# Patient Record
Sex: Female | Born: 1995 | Race: White | Hispanic: No | Marital: Single | Smoking: Never smoker
Health system: Southern US, Community
[De-identification: ages and names within clinical notes are randomized; demographics above are authoritative.]

## PROBLEM LIST (undated history)

## (undated) VITALS — BP 80/46 | HR 109 | Temp 98.0°F | Resp 18 | Ht 69.25 in | Wt 187.4 lb

## (undated) DIAGNOSIS — F913 Oppositional defiant disorder: Secondary | ICD-10-CM

## (undated) DIAGNOSIS — J302 Other seasonal allergic rhinitis: Secondary | ICD-10-CM

## (undated) DIAGNOSIS — F909 Attention-deficit hyperactivity disorder, unspecified type: Secondary | ICD-10-CM

## (undated) DIAGNOSIS — F319 Bipolar disorder, unspecified: Secondary | ICD-10-CM

## (undated) DIAGNOSIS — E669 Obesity, unspecified: Secondary | ICD-10-CM

## (undated) HISTORY — DX: Other seasonal allergic rhinitis: J30.2

## (undated) HISTORY — DX: Bipolar disorder, unspecified: F31.9

## (undated) HISTORY — DX: Oppositional defiant disorder: F91.3

## (undated) HISTORY — DX: Attention-deficit hyperactivity disorder, unspecified type: F90.9

## (undated) HISTORY — PX: ADENOIDECTOMY WITH MYRINGOTOMY: SHX5715

## (undated) HISTORY — PX: OTHER SURGICAL HISTORY: SHX169

---
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
## 2016-01-23 ENCOUNTER — Other Ambulatory Visit: Payer: Self-pay | Admitting: Pediatrics

## 2016-02-13 ENCOUNTER — Encounter: Payer: Medicaid Other | Admitting: Pediatrics

## 2016-02-23 ENCOUNTER — Ambulatory Visit (INDEPENDENT_AMBULATORY_CARE_PROVIDER_SITE_OTHER): Payer: Medicaid Other | Admitting: Psychiatry

## 2016-02-23 ENCOUNTER — Encounter (HOSPITAL_COMMUNITY): Payer: Self-pay | Admitting: Psychiatry

## 2016-02-23 ENCOUNTER — Other Ambulatory Visit: Payer: Self-pay | Admitting: Pediatrics

## 2016-02-23 VITALS — BP 90/55 | HR 80 | Ht 68.01 in | Wt 196.0 lb

## 2016-02-23 DIAGNOSIS — F39 Unspecified mood [affective] disorder: Secondary | ICD-10-CM | POA: Diagnosis not present

## 2016-02-23 DIAGNOSIS — F902 Attention-deficit hyperactivity disorder, combined type: Secondary | ICD-10-CM | POA: Diagnosis not present

## 2016-02-23 MED ORDER — RISPERIDONE 0.5 MG PO TABS: 0.5000 mg | ORAL_TABLET | Freq: Two times a day (BID) | ORAL | 3 refills | Status: DC

## 2016-02-23 MED ORDER — GUANFACINE HCL 1 MG PO TABS: 1.0000 mg | ORAL_TABLET | Freq: Every day | ORAL | 2 refills | Status: DC

## 2016-02-23 MED ORDER — TRAZODONE HCL 100 MG PO TABS: 200.0000 mg | ORAL_TABLET | Freq: Every day | ORAL | 3 refills | Status: DC

## 2016-02-23 MED ORDER — BUPROPION HCL ER (XL) 300 MG PO TB24: 300.0000 mg | ORAL_TABLET | Freq: Every day | ORAL | 2 refills | Status: DC
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
## 2016-03-01 ENCOUNTER — Telehealth (HOSPITAL_COMMUNITY): Payer: Self-pay | Admitting: *Deleted

## 2016-03-01 NOTE — Telephone Encounter (Signed)
patient's medical records picked up today by Arlis Porta at 4:15 p.m. [place] [facility] # [id number].

## 2016-03-06 ENCOUNTER — Encounter: Payer: Self-pay | Admitting: Pediatrics

## 2016-03-06 ENCOUNTER — Ambulatory Visit (INDEPENDENT_AMBULATORY_CARE_PROVIDER_SITE_OTHER): Payer: Medicaid Other | Admitting: Pediatrics

## 2016-03-06 VITALS — BP 90/62 | Ht 68.11 in | Wt 196.6 lb

## 2016-03-06 DIAGNOSIS — M545 Low back pain, unspecified: Secondary | ICD-10-CM

## 2016-03-06 DIAGNOSIS — Z113 Encounter for screening for infections with a predominantly sexual mode of transmission: Secondary | ICD-10-CM | POA: Diagnosis not present

## 2016-03-06 DIAGNOSIS — Z68.41 Body mass index (BMI) pediatric, greater than or equal to 95th percentile for age: Secondary | ICD-10-CM

## 2016-03-06 DIAGNOSIS — Z0000 Encounter for general adult medical examination without abnormal findings: Secondary | ICD-10-CM

## 2016-03-06 DIAGNOSIS — E7800 Pure hypercholesterolemia, unspecified: Secondary | ICD-10-CM | POA: Diagnosis not present

## 2016-03-06 DIAGNOSIS — IMO0001 Reserved for inherently not codable concepts without codable children: Secondary | ICD-10-CM

## 2016-03-06 MED ORDER — MONTELUKAST SODIUM 10 MG PO TABS: ORAL_TABLET | ORAL | 3 refills | Status: DC

## 2016-03-06 MED ORDER — NORGESTIM-ETH ESTRAD TRIPHASIC 0.18/0.215/0.25 MG-25 MCG PO TABS: 1.0000 | ORAL_TABLET | Freq: Every day | ORAL | 6 refills | Status: DC

## 2016-03-06 MED ORDER — AZELASTINE HCL 0.15 % NA SOLN: NASAL | 3 refills | Status: DC

## 2016-03-06 MED ORDER — CETIRIZINE HCL 10 MG PO TABS: ORAL_TABLET | ORAL | 6 refills | Status: DC

## 2016-03-06 NOTE — Progress Notes (Signed)
Lower back lmp end oj July Routine Well-Adolescent Visit  Tracy [name]'s personal or confidential phone number:   PCP: Tracy LeavenMary Jo Kayvon Mo, MD   History was provided by the patient and mother.  Tracy [name] is a 20 y.o. female who is here for physical.   Current concerns: has c/o lower back pain, is nonradiating no h/o injury. Mom stated has had for years, had previous evaluation including xrays in 2013,  No Known Allergies  Current Outpatient Prescriptions on File Prior to Visit  Medication Sig Dispense Refill  . buPROPion (WELLBUTRIN XL) 300 MG 24 hr tablet Take 1 tablet (300 mg total) by mouth daily. 90 tablet 2  . guanFACINE (TENEX) 1 MG tablet Take 1 tablet (1 mg total) by mouth at bedtime. 90 tablet 2  . polyethylene glycol (MIRALAX / GLYCOLAX) packet Take 17 g by mouth daily. Reported on 08/16/2015    . risperiDONE (RISPERDAL) 0.5 MG tablet Take 1 tablet (0.5 mg total) by mouth 2 (two) times daily. 180 tablet 3  . traZODone (DESYREL) 100 MG tablet Take 2 tablets (200 mg total) by mouth at bedtime. 180 tablet 3   No current facility-administered medications on file prior to visit.     Past Medical History:  Diagnosis Date  . ADHD (attention deficit hyperactivity disorder)   . Bipolar disorder (HCC)   . Obesity   . Oppositional defiant disorder   . Seasonal allergies     ROS:     Constitutional  Afebrile, normal appetite, normal activity.   Opthalmologic  no irritation or drainage.   ENT  no rhinorrhea or congestion , no sore throat, no ear pain. Cardiovascular  No chest pain Respiratory  no cough , wheeze or chest pain.  Gastointestinal  no abdominal pain, nausea or vomiting, bowel movements normal.     Genitourinary  no urgency, frequency or dysuria.   Musculoskeletal  no complaints of pain, no injuries.   Dermatologic  no rashes or lesions Neurologic - no significant history of headaches, no weakness  family history includes ADD / ADHD in her sister;  Bipolar disorder in her maternal grandmother and sister.    Adolescent Assessment:  Confidentiality was discussed with the patient and if applicable, with caregiver as well.  Home and Environment:  Social History   Social History Narrative   Lives with mom and younger sister, will be attending college this year     Sports/Exercise:  regularly has been walking   Education and Employment:  School Status: graduated HS, to attend community college School History:  Work:  Activities:  With parent out of the room and confidentiality discussed:   Patient reports being comfortable and safe at school and at home? Yes  Smoking: no Secondhand smoke exposure? yes -  Drugs/EtOH: no   Sexuality:  -Menarche: age - females:  last menses: last month  - Sexually active? no  - sexual partners in last year:  - contraception use: abstinence - Last STI Screening: 11/05/14  - Violence/Abuse:   Mood: Suicidality and Depression:  Weapons:   Screenings:  addition, the following topics were discussed as part of anticipatory guidance exercise.  PHQ-9 completed and results indicated mild issues score 8, has counseling high score for slow speech but has learning disability   Hearing Screening   125Hz  250Hz  500Hz  1000Hz  2000Hz  3000Hz  4000Hz  6000Hz  8000Hz   Right ear:   20 20 20 20 20     Left ear:   20 20 20 20  20  Visual Acuity Screening   Right eye Left eye Both eyes  Without correction: 20/30 20/25   With correction:         Physical Exam:  BP 90/62   Ht 5' 8.11" (1.73 m)   Wt 196 lb 9.6 oz (89.2 kg)   BMI 29.80 kg/m   Weight: 97 %ile (Z= 1.91) based on CDC 2-20 Years weight-for-age data using vitals from 03/06/2016. Normalized weight-for-stature data available only for age 33 to 5 years.  Height: 93 %ile (Z= 1.51) based on CDC 2-20 Years stature-for-age data using vitals from 03/06/2016.  Blood pressure percentiles are 1.2 % systolic and 33.4 % diastolic based on NHBPEP's 4th  Report.     Objective:          General alert in NAD  Derm   no rashes or lesions  Head Normocephalic, atraumatic                    Eyes Normal, no discharge  Ears:   TMs normal bilaterally  Nose:   patent normal mucosa, turbinates normal, no rhinorhea  Oral cavity  moist mucous membranes, no lesions  Throat:   normal tonsils, without exudate or erythema  Neck:   .supple FROM  Lymph:  no significant cervical adenopathy  Breast  Tanner 5  Lungs:   clear with equal breath sounds bilaterally  Heart regular rate and rhythm, no murmur  Abdomen soft nontender no organomegaly or masses  GU:  normal female Tanner 5  back No deformity no scoliosis  Extremities:   no deformity  Neuro:  intact no focal defects       Assessment/Plan:  1. Well adult   2. Routine screening for STI (sexually transmitted infection)  - GC/Chlamydia Probe Amp - HIV antibody (with reflex)  3. Elevated cholesterol Was previously prevastatin, for mild elevation last cholesterol off med 220 , should repeat fasting. If higher may need referral for management - Lipid panel - Comprehensive metabolic panel   4. BMI 29.0-29.9,adult Weight and BMI stable,   5. Midline low back pain without sciatica Pain c/w with muscle strain, chronic low back pain. Would benefit from moderate weight loss , some pain likely due to large bust- ( hasDD cup), advised core muscle stregthening exercises   .  BMI: is not appropriate for age  Counseling completed for all of the following vaccine components  Orders Placed This Encounter  Procedures  . GC/Chlamydia Probe Amp  . Lipid panel  . Comprehensive metabolic panel  . HIV antibody (with reflex)    Return in 6 months (on 09/06/2016).  Tracy Leaven, MD

## 2016-03-06 NOTE — Patient Instructions (Signed)
Try core strenghtening exercises for back pain. - leg lifts, planking, crunches. Weight is good but a small weight loss may also help the pain  Back Pain, Adult Back pain is very common in adults.The cause of back pain is rarely dangerous and the pain often gets better over time.The cause of your back pain may not be known. Some common causes of back pain include:  Strain of the muscles or ligaments supporting the spine.  Wear and tear (degeneration) of the spinal disks.  Arthritis.  Direct injury to the back. For many people, back pain may return. Since back pain is rarely dangerous, most people can learn to manage this condition on their own. HOME CARE INSTRUCTIONS Watch your back pain for any changes. The following actions may help to lessen any discomfort you are feeling:  Remain active. It is stressful on your back to sit or stand in one place for long periods of time. Do not sit, drive, or stand in one place for more than 30 minutes at a time. Take short walks on even surfaces as soon as you are able.Try to increase the length of time you walk each day.  Exercise regularly as directed by your health care provider. Exercise helps your back heal faster. It also helps avoid future injury by keeping your muscles strong and flexible.  Do not stay in bed.Resting more than 1-2 days can delay your recovery.  Pay attention to your body when you bend and lift. The most comfortable positions are those that put less stress on your recovering back. Always use proper lifting techniques, including:  Bending your knees.  Keeping the load close to your body.  Avoiding twisting.  Find a comfortable position to sleep. Use a firm mattress and lie on your side with your knees slightly bent. If you lie on your back, put a pillow under your knees.  Avoid feeling anxious or stressed.Stress increases muscle tension and can worsen back pain.It is important to recognize when you are anxious or  stressed and learn ways to manage it, such as with exercise.  Take medicines only as directed by your health care provider. Over-the-counter medicines to reduce pain and inflammation are often the most helpful.Your health care provider may prescribe muscle relaxant drugs.These medicines help dull your pain so you can more quickly return to your normal activities and healthy exercise.  Apply ice to the injured area:  Put ice in a plastic bag.  Place a towel between your skin and the bag.  Leave the ice on for 20 minutes, 2-3 times a day for the first 2-3 days. After that, ice and heat may be alternated to reduce pain and spasms.  Maintain a healthy weight. Excess weight puts extra stress on your back and makes it difficult to maintain good posture. SEEK MEDICAL CARE IF:  You have pain that is not relieved with rest or medicine.  You have increasing pain going down into the legs or buttocks.  You have pain that does not improve in one week.  You have night pain.  You lose weight.  You have a fever or chills. SEEK IMMEDIATE MEDICAL CARE IF:   You develop new bowel or bladder control problems.  You have unusual weakness or numbness in your arms or legs.  You develop nausea or vomiting.  You develop abdominal pain.  You feel faint.   This information is not intended to replace advice given to you by your health care provider. Make sure you discuss  any questions you have with your health care provider.   Document Released: 07/16/2005 Document Revised: 08/06/2014 Document Reviewed: 11/17/2013 Elsevier Interactive Patient Education [employer].

## 2016-03-07 LAB — GC/CHLAMYDIA PROBE AMP
CT Probe RNA: NOT DETECTED
GC Probe RNA: NOT DETECTED

## 2016-03-17 LAB — COMPREHENSIVE METABOLIC PANEL
ALT: 11 U/L (ref 5–32)
AST: 14 U/L (ref 12–32)
Albumin: 3.8 g/dL (ref 3.6–5.1)
Alkaline Phosphatase: 52 U/L (ref 47–176)
BUN: 8 mg/dL (ref 7–20)
CO2: 25 mmol/L (ref 20–31)
Calcium: 8.7 mg/dL — ABNORMAL LOW (ref 8.9–10.4)
Chloride: 105 mmol/L (ref 98–110)
Creat: 1.01 mg/dL — ABNORMAL HIGH (ref 0.50–1.00)
Glucose, Bld: 75 mg/dL (ref 65–99)
Potassium: 4.7 mmol/L (ref 3.8–5.1)
Sodium: 140 mmol/L (ref 135–146)
Total Bilirubin: 0.3 mg/dL (ref 0.2–1.1)
Total Protein: 6 g/dL — ABNORMAL LOW (ref 6.3–8.2)

## 2016-03-17 LAB — LIPID PANEL
Cholesterol: 230 mg/dL — ABNORMAL HIGH (ref 125–170)
HDL: 57 mg/dL (ref 36–76)
LDL Cholesterol: 155 mg/dL — ABNORMAL HIGH (ref <110)
Total CHOL/HDL Ratio: 4 Ratio (ref <=5.0)
Triglycerides: 91 mg/dL (ref 40–136)
VLDL: 18 mg/dL (ref <30)

## 2016-03-18 LAB — HIV ANTIBODY (ROUTINE TESTING W REFLEX): HIV 1&2 Ab, 4th Generation: NONREACTIVE

## 2016-03-19 ENCOUNTER — Telehealth: Payer: Self-pay | Admitting: Pediatrics

## 2016-03-19 DIAGNOSIS — E785 Hyperlipidemia, unspecified: Secondary | ICD-10-CM

## 2016-03-19 NOTE — Telephone Encounter (Signed)
Spoke with mom,  Reviewed results , cholesterol and LDL has increased, will refer endocrinology for evaluation

## 2016-05-24 ENCOUNTER — Encounter (HOSPITAL_COMMUNITY): Payer: Self-pay | Admitting: Psychiatry

## 2016-05-24 ENCOUNTER — Ambulatory Visit (INDEPENDENT_AMBULATORY_CARE_PROVIDER_SITE_OTHER): Payer: Medicaid Other | Admitting: Psychiatry

## 2016-05-24 VITALS — BP 98/47 | HR 73 | Ht 68.12 in | Wt 191.0 lb

## 2016-05-24 DIAGNOSIS — Z818 Family history of other mental and behavioral disorders: Secondary | ICD-10-CM | POA: Diagnosis not present

## 2016-05-24 DIAGNOSIS — F39 Unspecified mood [affective] disorder: Secondary | ICD-10-CM

## 2016-05-24 DIAGNOSIS — F902 Attention-deficit hyperactivity disorder, combined type: Secondary | ICD-10-CM | POA: Diagnosis not present

## 2016-05-24 MED ORDER — RISPERIDONE 0.5 MG PO TABS: 0.5000 mg | ORAL_TABLET | Freq: Two times a day (BID) | ORAL | 3 refills | Status: DC

## 2016-05-24 MED ORDER — TRAZODONE HCL 100 MG PO TABS: 200.0000 mg | ORAL_TABLET | Freq: Every day | ORAL | 3 refills | Status: DC

## 2016-05-24 MED ORDER — BUPROPION HCL ER (XL) 300 MG PO TB24: 300.0000 mg | ORAL_TABLET | Freq: Every day | ORAL | 2 refills | Status: DC
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
## 2016-08-01 ENCOUNTER — Other Ambulatory Visit: Payer: Self-pay | Admitting: Pediatrics

## 2016-08-15 ENCOUNTER — Telehealth (HOSPITAL_COMMUNITY): Payer: Self-pay | Admitting: *Deleted

## 2016-08-15 ENCOUNTER — Ambulatory Visit (HOSPITAL_COMMUNITY): Payer: Medicaid Other | Admitting: Psychiatry

## 2016-08-15 NOTE — Telephone Encounter (Signed)
lmtcb on both numbers on file to resch appt for 08-15-16 due to weather.  

## 2016-08-20 ENCOUNTER — Ambulatory Visit (HOSPITAL_COMMUNITY): Payer: Medicaid Other | Admitting: Psychiatry

## 2016-08-26 ENCOUNTER — Other Ambulatory Visit: Payer: Self-pay | Admitting: Pediatrics

## 2016-09-07 ENCOUNTER — Ambulatory Visit (INDEPENDENT_AMBULATORY_CARE_PROVIDER_SITE_OTHER): Payer: Medicaid Other | Admitting: Psychiatry

## 2016-09-07 ENCOUNTER — Encounter (HOSPITAL_COMMUNITY): Payer: Self-pay | Admitting: Psychiatry

## 2016-09-07 VITALS — BP 105/68 | HR 85 | Ht 68.13 in | Wt 190.6 lb

## 2016-09-07 DIAGNOSIS — Z79899 Other long term (current) drug therapy: Secondary | ICD-10-CM | POA: Diagnosis not present

## 2016-09-07 DIAGNOSIS — F902 Attention-deficit hyperactivity disorder, combined type: Secondary | ICD-10-CM | POA: Diagnosis not present

## 2016-09-07 DIAGNOSIS — F39 Unspecified mood [affective] disorder: Secondary | ICD-10-CM

## 2016-09-07 DIAGNOSIS — Z818 Family history of other mental and behavioral disorders: Secondary | ICD-10-CM | POA: Diagnosis not present

## 2016-09-07 MED ORDER — TRAZODONE HCL 100 MG PO TABS: 200.0000 mg | ORAL_TABLET | Freq: Every day | ORAL | 3 refills | Status: DC

## 2016-09-07 MED ORDER — RISPERIDONE 0.5 MG PO TABS: 0.5000 mg | ORAL_TABLET | Freq: Two times a day (BID) | ORAL | 3 refills | Status: DC

## 2016-09-07 MED ORDER — BUPROPION HCL ER (XL) 300 MG PO TB24: 300.0000 mg | ORAL_TABLET | Freq: Every day | ORAL | 2 refills | Status: DC
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
## 2016-09-24 ENCOUNTER — Other Ambulatory Visit: Payer: Self-pay | Admitting: Pediatrics

## 2016-09-30 ENCOUNTER — Other Ambulatory Visit: Payer: Self-pay | Admitting: Pediatrics

## 2016-09-30 NOTE — Telephone Encounter (Signed)
[facility] to have follow up appt[facility]

## 2016-10-01 NOTE — Telephone Encounter (Signed)
lvm asking pt to call and make appointment

## 2016-10-09 ENCOUNTER — Ambulatory Visit: Payer: Medicaid Other | Admitting: Pediatrics

## 2016-10-16 ENCOUNTER — Encounter: Payer: Self-pay | Admitting: Pediatrics

## 2016-10-16 ENCOUNTER — Ambulatory Visit (INDEPENDENT_AMBULATORY_CARE_PROVIDER_SITE_OTHER): Payer: Medicaid Other | Admitting: Pediatrics

## 2016-10-16 VITALS — BP 120/70 | Temp 97.8°F | Wt 191.4 lb

## 2016-10-16 DIAGNOSIS — M546 Pain in thoracic spine: Secondary | ICD-10-CM | POA: Diagnosis not present

## 2016-10-16 DIAGNOSIS — Z3041 Encounter for surveillance of contraceptive pills: Secondary | ICD-10-CM | POA: Diagnosis not present

## 2016-10-16 DIAGNOSIS — E7800 Pure hypercholesterolemia, unspecified: Secondary | ICD-10-CM

## 2016-10-16 DIAGNOSIS — G8929 Other chronic pain: Secondary | ICD-10-CM

## 2016-10-16 NOTE — Progress Notes (Signed)
Cholesterol Chief Complaint  Patient presents with  . Follow-up    HPI Tracy Benerianna R Weilfrazieris here for follow-up for birth control , she is not having any issues. No breakthrough bleeding, no headaches. States still on BCP for menstrual control Still having back pain   History was provided by the . patient and mother.  No Known Allergies  Current Outpatient Prescriptions on File Prior to Visit  Medication Sig Dispense Refill  . Azelastine HCl (ASTEPRO) 0.15 % SOLN USE 2 SPRAYS INTO EACH NOSTRIL DAILY 30 mL 3  . buPROPion (WELLBUTRIN XL) 300 MG 24 hr tablet Take 1 tablet (300 mg total) by mouth daily. 90 tablet 2  . cetirizine (ZYRTEC) 10 MG tablet TAKE 1 TABLET(10 MG) BY MOUTH DAILY 30 tablet 6  . montelukast (SINGULAIR) 10 MG tablet TAKE 1 TABLET BY MOUTH EVERY DAY 30 tablet 0  . polyethylene glycol (MIRALAX / GLYCOLAX) packet Take 17 g by mouth daily. Reported on 08/16/2015    . risperiDONE (RISPERDAL) 0.5 MG tablet Take 1 tablet (0.5 mg total) by mouth 2 (two) times daily. 180 tablet 3  . traZODone (DESYREL) 100 MG tablet Take 2 tablets (200 mg total) by mouth at bedtime. 180 tablet 3  . TRINESSA LO 0.18/0.215/0.25 MG-25 MCG tab TAKE 1 TABLET BY MOUTH DAILY 28 tablet 0   No current facility-administered medications on file prior to visit.     Past Medical History:  Diagnosis Date  . ADHD (attention deficit hyperactivity disorder)   . Bipolar disorder (HCC)   . Obesity   . Oppositional defiant disorder   . Seasonal allergies     ROS:     Constitutional  Afebrile, normal appetite, normal activity.   Opthalmologic  no irritation or drainage.   ENT  no rhinorrhea or congestion , no sore throat, no ear pain. Respiratory  no cough , wheeze or chest pain.  Gastrointestinal  no nausea or vomiting,   Genitourinary  Voiding normally  Musculoskeletal  no complaints of pain, no injuries.   Dermatologic  no rashes or lesions    family history includes ADD / ADHD in her sister;  Bipolar disorder in her maternal grandmother and sister.  Social History   Social History Narrative   Lives with mom and younger sister, will be attending college this year    BP 120/70   Temp 97.8 F (36.6 C) (Temporal)   Wt 191 lb 6.4 oz (86.8 kg)   BMI 28.99 kg/m   96 %ile (Z= 1.81) based on CDC 2-20 Years weight-for-age data using vitals from 10/16/2016. No height on file for this encounter. 92 %ile (Z= 1.40) based on CDC 2-20 Years BMI-for-age data using weight from 10/16/2016 and height from 09/07/2016.      Objective:         General alert in NAD  Derm   no rashes or lesions  Head Normocephalic, atraumatic                    Eyes Normal, no discharge  Ears:   TMs normal bilaterally  Nose:   patent normal mucosa, turbinates normal, no rhinorhea  Oral cavity  moist mucous membranes, no lesions  Throat:   normal tonsils, without exudate or erythema  Neck supple FROM  Lymph:   no significant cervical adenopathy  Lungs:  clear with equal breath sounds bilaterally  Heart:   regular rate and rhythm, no murmur  Abdomen:  deferred  GU:  deferred  back No deformity  Extremities:   no deformity  Neuro:  intact no focal defects     Assessment/plan   1. Surveillance for birth control, oral contraceptives To continue trinessa Discussed indications for gyn follow-up, does not need pelvic exam unless sexually active but does become routine adult care  2. Elevated cholesterol Previously on prevastatin  - Ambulatory referral to Pediatric Endocrinology  3. Chronic midline thoracic back pain Has had for years, xray done 2013 - Ambulatory referral to Physical Therapy      Follow up  Will be 21 this year, discussed transition to adult care Follow-up  68mo if not seeing adult provider

## 2016-10-27 ENCOUNTER — Other Ambulatory Visit: Payer: Self-pay | Admitting: Pediatrics

## 2016-10-30 ENCOUNTER — Other Ambulatory Visit: Payer: Self-pay | Admitting: Pediatrics

## 2016-11-06 ENCOUNTER — Ambulatory Visit (HOSPITAL_COMMUNITY): Payer: Medicaid Other | Attending: Pediatrics | Admitting: Physical Therapy

## 2016-11-06 ENCOUNTER — Encounter (HOSPITAL_COMMUNITY): Payer: Self-pay | Admitting: Physical Therapy

## 2016-11-06 DIAGNOSIS — G8929 Other chronic pain: Secondary | ICD-10-CM | POA: Diagnosis present

## 2016-11-06 DIAGNOSIS — M545 Low back pain, unspecified: Secondary | ICD-10-CM

## 2016-11-06 DIAGNOSIS — R293 Abnormal posture: Secondary | ICD-10-CM | POA: Diagnosis present

## 2016-11-06 NOTE — Therapy (Signed)
[facility] [address] [place], [place], [address] Phone: [phone]   Fax:  [phone]  Physical Therapy Evaluation/ (one time visit)  Patient Details  Name: Tracy [name] MRN: [id number] Date of Birth: [birth year] Referring Provider: Carma Leaven, MD  Encounter Date: 11/06/2016      PT End of Session - 11/06/16 1646    Visit Number 1   Number of Visits 1   Authorization Type [employer]   Authorization Time Period 11/06/16 to 11/06/16   PT Start Time 1558   PT Stop Time 1645   PT Time Calculation (min) 47 min   Activity Tolerance Patient tolerated treatment well;No increased pain   Behavior During Therapy WFL for tasks assessed/performed      Past Medical History:  Diagnosis Date  . ADHD (attention deficit hyperactivity disorder)   . Bipolar disorder (HCC)   . Obesity   . Oppositional defiant disorder   . Seasonal allergies     Past Surgical History:  Procedure Laterality Date  . ADENOIDECTOMY WITH MYRINGOTOMY    . corrective surgery for strabismus     left eye    There were no vitals filed for this visit.       Subjective Assessment - 11/06/16 1602    Subjective Pt reports that she has had upper back pain for about 5 years now. She says that sometimes she will just have sharp pain in her back but she is not sure what causes it. She states that her pain increases when she slouches. She had therapy back in 2013 but she states this did not help and sometimes woudl make it worse.    Pertinent History ADHD, bipolar disorder, obesity.    Patient Stated Goals improve pain   Currently in Pain? Yes   Pain Score 6   via faces scale however pt was very content sitting in the exam room without any signs of distress   Pain Location Back   Pain Orientation Lower;Mid   Pain Descriptors / Indicators Aching;Dull;Tightness   Pain Type Chronic pain   Pain Radiating Towards none    Pain Onset More than a  month ago   Pain Frequency Constant   Aggravating Factors  slouching   Pain Relieving Factors Pt doesn't know             [facility] PT Assessment - 11/06/16 0001      Assessment   Medical Diagnosis Mid lower thoracic pain   Referring Provider Alfredia Client McDonell, MD   Onset Date/Surgical Date --  ~5 years ago   Next MD Visit unsure    Prior Therapy 2013 but this was reportedly unsuccessful      Precautions   Precautions None     Balance Screen   Has the patient fallen in the past 6 months No   Has the patient had a decrease in activity level because of a fear of falling?  No   Is the patient reluctant to leave their home because of a fear of falling?  No     Home [employer]ist information centre manager residence     Prior Function   Level of Independence Independent   Leisure Has 3 siblings, graduated from [employer] but currently not enrolled in any additional schooling      Sensation   Light Touch Appears Intact     ROM / Strength   AROM / PROM / Strength AROM;Strength  AROM   Overall AROM Comments Thoracic AROM in all directions full, pt response unclear with fluctuating pain responses BUE strength full                    [facility] Adult PT Treatment/Exercise - 11/06/16 0001      Exercises   Exercises Lumbar     Lumbar Exercises: Stretches   Lower Trunk Rotation 1 rep;20 seconds   Lower Trunk Rotation Limitations Lt and Rt      Lumbar Exercises: Seated   Other Seated Lumbar Exercises repeated thoracic flexion/extension/rotation Lt and Rt/lateral flexion Lt and Rt x10 reps each     Lumbar Exercises: Prone   Other Prone Lumbar Exercises prone press up with elbows extended x10 reps      Lumbar Exercises: Quadruped   Other Quadruped Lumbar Exercises childs pose low back stretch 5x10 sec      Manual Therapy   Manual Therapy Soft tissue mobilization   Manual therapy comments separate rest of session   Soft tissue mobilization STM lumbar  multifidus, lumbar paraspinals                 PT Education - 11/06/16 1645    Education provided Yes   Education Details eval findings/POC; importance of decreasing time playing video games and adjusting posture while doing this to decrease muscle discomfort; reviewed detailed HEP; use of lumbar roll    Person(s) Educated Patient   Methods Explanation;Demonstration;Verbal cues;Handout   Comprehension Verbalized understanding;Returned demonstration          PT Short Term Goals - 11/06/16 1648      PT SHORT TERM GOAL #1   Title Pt will demo understanding of HEP to allow her to self correct posture and decrease mid back pain.    Time 1   Period Days   Status Achieved                  Plan - 11/06/16 1647    Clinical Impression Statement Pt is a pleasant 21yo F referred to OPPT with complaints of chronic mid thoracic/lumbar back pain ongoing for ~5 years. Pt is a poor historian, finding it difficult to describe her symptoms as well as specific causes and alleviating factors. She currently reports playing video games up to 6 hours a day and when asked to demonstrate her sitting posture, she displays typical slouched posture. This is also the only position that she reports will increase her pain. Therapist educated pt on the importance of decreasing time spent playing video games and taking intermittent breaks to perform stretches. She verbalized understanding of this and demonstrated proper technique of several stretches that were provided. Therapist also performed soft tissue massage which resulted in significant decrease in pt's pain report from 6 down to a 2/10 on the visual analogue scale. At this time, the pt is comfortable with the things discussed and demonstrates good understanding of her HEP. She also had a decrease in her pain following manual treatment. She does not require further skilled PT as things should continue to improve once she makes the necessary  adjustments. Pt is agreeable with this and pleased with her results at the end of the session.   Rehab Potential Good   PT Frequency One time visit   PT Treatment/Interventions ADLs/Self Care Home Management;Moist Heat;Neuromuscular re-education;Therapeutic exercise;Therapeutic activities;Patient/family education;Manual techniques;Passive range of motion   PT Next Visit Plan N/A due to one time visit    PT Home Exercise Plan seated  thoracic rotation/extension AROM, child's pose stretch, low trunk rotation stretch, lumbar roll, correct posture diagram, standing rows with green TB   Recommended Other Services none    Consulted and Agree with Plan of Care Patient      Patient will benefit from skilled therapeutic intervention in order to improve the following deficits and impairments:  Postural dysfunction, Pain  Visit Diagnosis: Chronic midline low back pain without sciatica  Abnormal posture     Problem List Patient Active Problem List   Diagnosis Date Noted  . HLD (hyperlipidemia) 04/14/2013  . Bipolar affective disorder, depressed, severe (HCC) 06/05/2012  . ADHD (attention deficit hyperactivity disorder), combined type 06/13/2011  . ODD (oppositional defiant disorder) 06/13/2011    5:37 PM,11/06/16 Marylyn Ishihara PT, DPT [facility] [phone]   [facility] [address] [place], [place], [address] Phone: [phone]   Fax:  [phone]  Name: LAKESHA LEVINSON MRN: [id number] Date of Birth: 06/24/1996

## 2016-12-04 ENCOUNTER — Ambulatory Visit (INDEPENDENT_AMBULATORY_CARE_PROVIDER_SITE_OTHER): Payer: Medicaid Other | Admitting: "Endocrinology

## 2016-12-04 ENCOUNTER — Encounter: Payer: Self-pay | Admitting: "Endocrinology

## 2016-12-04 VITALS — BP 101/70 | HR 89 | Ht 68.13 in | Wt 187.0 lb

## 2016-12-04 DIAGNOSIS — E782 Mixed hyperlipidemia: Secondary | ICD-10-CM | POA: Diagnosis not present

## 2016-12-04 NOTE — Progress Notes (Signed)
Subjective:    Patient ID: Tracy [name], female    DOB: [birth year], PCP McDonell, Kyra Manges, MD   Past Medical History:  Diagnosis Date  . ADHD (attention deficit hyperactivity disorder)   . Bipolar disorder ([place])   . Obesity   . Oppositional defiant disorder   . Seasonal allergies    Past Surgical History:  Procedure Laterality Date  . ADENOIDECTOMY WITH MYRINGOTOMY    . corrective surgery for strabismus     left eye   Social History   Social History  . Marital status: Single    Spouse name: N/A  . Number of children: N/A  . Years of education: N/A   Occupational History  . Student Unemployed    9th grade at [employer] History Main Topics  . Smoking status: Passive Smoke Exposure - Never Smoker  . Smokeless tobacco: Never Used  . Alcohol use No  . Drug use: No  . Sexual activity: No   Other Topics Concern  . None   Social History Narrative   Lives with mom and younger sister, will be attending college this year   Outpatient Encounter Prescriptions as of 12/04/2016  Medication Sig  . Azelastine HCl (ASTEPRO) 0.15 % SOLN USE 2 SPRAYS INTO EACH NOSTRIL DAILY  . buPROPion (WELLBUTRIN XL) 300 MG 24 hr tablet Take 1 tablet (300 mg total) by mouth daily.  . cetirizine (ZYRTEC) 10 MG tablet TAKE 1 TABLET(10 MG) BY MOUTH DAILY  . montelukast (SINGULAIR) 10 MG tablet TAKE 1 TABLET BY MOUTH EVERY DAY  . polyethylene glycol (MIRALAX / GLYCOLAX) packet Take 17 g by mouth daily. Reported on 08/16/2015  . risperiDONE (RISPERDAL) 0.5 MG tablet Take 1 tablet (0.5 mg total) by mouth 2 (two) times daily.  . traZODone (DESYREL) 100 MG tablet Take 2 tablets (200 mg total) by mouth at bedtime.  Bertram Millard LO 0.18/0.215/0.25 MG-25 MCG tab TAKE 1 TABLET BY MOUTH EVERY DAY   No facility-administered encounter medications on file as of 12/04/2016.    ALLERGIES: No Known Allergies  VACCINATION STATUS: Immunization History  Administered Date(s) Administered  .  DTaP 06/26/1997, 09/28/1997, 12/03/1997, 10/06/1998, 06/30/2001  . HPV Quadrivalent 05/09/2007, 08/05/2007, 01/19/2008  . Hepatitis A 09/09/2012, 10/09/2013  . Hepatitis A, Ped/Adol-2 Dose 11/05/2014  . Hepatitis B 03/31/1997, 05/20/1997, 03/05/1998  . HiB (PRP-OMP) 06/26/1997, 09/28/1997, 12/03/1997, 08/30/1998  . IPV 06/26/1997, 09/28/1997, 08/30/1998, 07/01/2001  . Influenza Nasal 05/21/2008, 06/13/2009, 09/09/2012  . Influenza Whole 05/09/2007, 08/28/2011  . Influenza,inj,Quad PF,36+ Mos 05/04/2014, 08/16/2015  . MMR 08/30/1998, 07/01/2001  . Meningococcal Conjugate 09/09/2012, 10/09/2013  . Td 02/10/2009  . Tdap 02/10/2009  . Varicella 08/30/1998, 11/05/2014    HPI 21 year old female patient with medical history as above. She is being seen in consultation for hyperlipidemia requested by McDonell, Kyra Manges, MD. - Reportedly, she was diagnosed with hyperlipidemia at age 67, previously treated with Lipitor until he was stopped more than a year ago. -She is not currently on any antidepressant medications. She denies any history of coronary artery disease. She has family history of hyperlipidemia in her mother who also denies history of coronary artery disease. - She does not take any particular precaution on her diet, consumes butter and fried food freely. -She is not particularly active physically and stays mostly sedentary. She denies smoking, alcohol abuse. -He denies any body rashes/lesions related to her cholesterol level. - She does not have a plan to get pregnant in the immediate future.  Review of Systems  Constitutional: + Steady weight, no fatigue, no subjective hyperthermia, no subjective hypothermia Eyes: no blurry vision, no xerophthalmia ENT: no sore throat, no nodules palpated in throat, no dysphagia/odynophagia, no hoarseness Cardiovascular: no Chest Pain, no Shortness of Breath, no palpitations, no leg swelling Respiratory: no cough, no SOB Gastrointestinal: no  Nausea/Vomiting/Diarhhea Musculoskeletal: no muscle/joint aches Skin: no rashes Neurological: no tremors, no numbness, no tingling, no dizziness Psychiatric: + depression, no anxiety  Objective:    BP 101/70   Pulse 89   Ht 5' 8.13" (1.731 m)   Wt 187 lb (84.8 kg)   BMI 28.32 kg/m   Wt Readings from Last 3 Encounters:  12/04/16 187 lb (84.8 kg) (96 %, Z= 1.73)*  10/16/16 191 lb 6.4 oz (86.8 kg) (96 %, Z= 1.81)*  03/06/16 196 lb 9.6 oz (89.2 kg) (97 %, Z= 1.91)*   * Growth percentiles are based on CDC 2-20 Years data.    Physical Exam  Constitutional: over weight for hight, not in acute distress, normal state of mind Eyes: PERRLA, EOMI, no exophthalmos ENT: moist mucous membranes, no thyromegaly, no cervical lymphadenopathy Cardiovascular: normal precordial activity, Regular Rate and Rhythm, no Murmur/Rubs/Gallops Respiratory:  adequate breathing efforts, no gross chest deformity, Clear to auscultation bilaterally Gastrointestinal: abdomen soft, Non -tender, No distension, Bowel Sounds present Musculoskeletal: no gross deformities, strength intact in all four extremities Skin: moist, warm, no rashes Neurological: no tremor with outstretched hands, Deep tendon reflexes normal in all four extremities.  CMP ( most recent) CMP     Component Value Date/Time   NA 140 03/17/2016 0944   K 4.7 03/17/2016 0944   CL 105 03/17/2016 0944   CO2 25 03/17/2016 0944   GLUCOSE 75 03/17/2016 0944   BUN 8 03/17/2016 0944   CREATININE 1.01 (H) 03/17/2016 0944   CALCIUM 8.7 (L) 03/17/2016 0944   PROT 6.0 (L) 03/17/2016 0944   ALBUMIN 3.8 03/17/2016 0944   AST 14 03/17/2016 0944   ALT 11 03/17/2016 0944   ALKPHOS 52 03/17/2016 0944   BILITOT 0.3 03/17/2016 0944   GFRNONAA NOT CALCULATED 06/06/2012 0639   GFRAA NOT CALCULATED 06/06/2012 0639     Diabetic Labs (most recent): Lab Results  Component Value Date   HGBA1C 5.1 05/27/2015   HGBA1C 5.0 09/11/2014   HGBA1C 5.2 06/06/2012      Lipid Panel ( most recent) Lipid Panel     Component Value Date/Time   CHOL 230 (H) 03/17/2016 0944   TRIG 91 03/17/2016 0944   HDL 57 03/17/2016 0944   CHOLHDL 4.0 03/17/2016 0944   VLDL 18 03/17/2016 0944   LDLCALC 155 (H) 03/17/2016 0944      Assessment & Plan:   1. Mixed hyperlipidemia - Patient is being seen at the kind request of  McDonell, Kyra Manges, MD. I have reviewed her available records and clinically evaluated this patient. 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 - Although statins in general are not studied in pediatric age group, if necessary, she may benefit from rosuvastatin if she continues to have significantly above target LDL level. - I have had a long discussion regarding diet and exercise with her and her mother in the room. She will need repeat fasting lipid panel along with Joneen Boers function tests, A1c, CMP, and vitamin D levels. - She will return in 1 week for reevaluation.  - I advised patient to maintain close follow up with McDonell, Kyra Manges, MD for primary care needs. Follow up plan:  Return in about 1 week (around 12/11/2016) for follow up with pre-visit labs.  Glade Lloyd, MD Phone: [phone]  Fax: [phone]   12/04/2016, 3:39 PM

## 2016-12-06 ENCOUNTER — Ambulatory Visit (INDEPENDENT_AMBULATORY_CARE_PROVIDER_SITE_OTHER): Payer: Medicaid Other | Admitting: Psychiatry

## 2016-12-06 ENCOUNTER — Encounter (HOSPITAL_COMMUNITY): Payer: Self-pay | Admitting: Psychiatry

## 2016-12-06 VITALS — BP 103/65 | HR 75 | Ht 68.13 in | Wt 184.4 lb

## 2016-12-06 DIAGNOSIS — Z818 Family history of other mental and behavioral disorders: Secondary | ICD-10-CM

## 2016-12-06 DIAGNOSIS — F39 Unspecified mood [affective] disorder: Secondary | ICD-10-CM | POA: Diagnosis not present

## 2016-12-06 DIAGNOSIS — Z79899 Other long term (current) drug therapy: Secondary | ICD-10-CM

## 2016-12-06 LAB — COMPREHENSIVE METABOLIC PANEL
ALBUMIN: 4.3 g/dL (ref 3.6–5.1)
ALT: 16 U/L (ref 5–32)
AST: 17 U/L (ref 12–32)
Alkaline Phosphatase: 59 U/L (ref 47–176)
BUN: 8 mg/dL (ref 7–20)
CALCIUM: 9.7 mg/dL (ref 8.9–10.4)
CHLORIDE: 104 mmol/L (ref 98–110)
CO2: 26 mmol/L (ref 20–31)
CREATININE: 1.1 mg/dL — AB (ref 0.50–1.00)
Glucose, Bld: 78 mg/dL (ref 65–99)
POTASSIUM: 5 mmol/L (ref 3.8–5.1)
Sodium: 139 mmol/L (ref 135–146)
TOTAL PROTEIN: 6.7 g/dL (ref 6.3–8.2)
Total Bilirubin: 0.7 mg/dL (ref 0.2–1.1)

## 2016-12-06 LAB — LIPID PANEL
CHOL/HDL RATIO: 4.2 ratio (ref <5.0)
Cholesterol: 250 mg/dL — ABNORMAL HIGH (ref <170)
HDL: 59 mg/dL (ref >45)
LDL Cholesterol: 173 mg/dL — ABNORMAL HIGH (ref <110)
Triglycerides: 90 mg/dL — ABNORMAL HIGH (ref <90)
VLDL: 18 mg/dL (ref <30)

## 2016-12-06 LAB — TSH: TSH: 0.58 m[IU]/L (ref 0.50–4.30)

## 2016-12-06 LAB — T4, FREE: Free T4: 1.2 ng/dL (ref 0.8–1.4)

## 2016-12-06 MED ORDER — RISPERIDONE 0.5 MG PO TABS: 0.5000 mg | ORAL_TABLET | Freq: Two times a day (BID) | ORAL | 3 refills | Status: DC

## 2016-12-06 MED ORDER — TRAZODONE HCL 100 MG PO TABS: 200.0000 mg | ORAL_TABLET | Freq: Every day | ORAL | 3 refills | Status: DC

## 2016-12-06 MED ORDER — BUPROPION HCL ER (XL) 300 MG PO TB24: 300.0000 mg | ORAL_TABLET | Freq: Every day | ORAL | 2 refills | Status: DC
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
## 2016-12-07 LAB — HEMOGLOBIN A1C
Hgb A1c MFr Bld: 4.7 % (ref <5.7)
Mean Plasma Glucose: 88 mg/dL

## 2016-12-07 LAB — VITAMIN D 25 HYDROXY (VIT D DEFICIENCY, FRACTURES): VIT D 25 HYDROXY: 42 ng/mL (ref 30–100)

## 2016-12-14 ENCOUNTER — Ambulatory Visit (INDEPENDENT_AMBULATORY_CARE_PROVIDER_SITE_OTHER): Payer: Medicaid Other | Admitting: "Endocrinology

## 2016-12-14 ENCOUNTER — Encounter: Payer: Self-pay | Admitting: "Endocrinology

## 2016-12-14 VITALS — BP 106/59 | HR 98 | Ht 68.13 in | Wt 186.0 lb

## 2016-12-14 DIAGNOSIS — E782 Mixed hyperlipidemia: Secondary | ICD-10-CM

## 2016-12-14 MED ORDER — CRESTOR 5 MG PO TABS: 5.0000 mg | ORAL_TABLET | Freq: Every day | ORAL | 3 refills | Status: DC

## 2016-12-14 NOTE — Progress Notes (Signed)
Subjective:    Patient ID: Tracy [name], female    DOB: Dec 22, 1995, PCP McDonell, Kyra Manges, MD   Past Medical History:  Diagnosis Date  . ADHD (attention deficit hyperactivity disorder)   . Bipolar disorder ([place])   . Obesity   . Oppositional defiant disorder   . Seasonal allergies    Past Surgical History:  Procedure Laterality Date  . ADENOIDECTOMY WITH MYRINGOTOMY    . corrective surgery for strabismus     left eye   Social History   Social History  . Marital status: Single    Spouse name: N/A  . Number of children: N/A  . Years of education: N/A   Occupational History  . Student Unemployed    9th grade at [employer] History Main Topics  . Smoking status: Passive Smoke Exposure - Never Smoker  . Smokeless tobacco: Never Used  . Alcohol use No  . Drug use: No  . Sexual activity: No   Other Topics Concern  . None   Social History Narrative   Lives with mom and younger sister, will be attending college this year   Outpatient Encounter Prescriptions as of 12/14/2016  Medication Sig  . Azelastine HCl (ASTEPRO) 0.15 % SOLN USE 2 SPRAYS INTO EACH NOSTRIL DAILY  . buPROPion (WELLBUTRIN XL) 300 MG 24 hr tablet Take 1 tablet (300 mg total) by mouth daily.  . cetirizine (ZYRTEC) 10 MG tablet TAKE 1 TABLET(10 MG) BY MOUTH DAILY  . CRESTOR 5 MG tablet Take 1 tablet (5 mg total) by mouth at bedtime.  . montelukast (SINGULAIR) 10 MG tablet TAKE 1 TABLET BY MOUTH EVERY DAY  . polyethylene glycol (MIRALAX / GLYCOLAX) packet Take 17 g by mouth daily. Reported on 08/16/2015  . risperiDONE (RISPERDAL) 0.5 MG tablet Take 1 tablet (0.5 mg total) by mouth 2 (two) times daily.  . traZODone (DESYREL) 100 MG tablet Take 2 tablets (200 mg total) by mouth at bedtime.  Bertram Millard LO 0.18/0.215/0.25 MG-25 MCG tab TAKE 1 TABLET BY MOUTH EVERY DAY   No facility-administered encounter medications on file as of 12/14/2016.    ALLERGIES: No Known  Allergies  VACCINATION STATUS: Immunization History  Administered Date(s) Administered  . DTaP 06/26/1997, 09/28/1997, 12/03/1997, 10/06/1998, 06/30/2001  . HPV Quadrivalent 05/09/2007, 08/05/2007, 01/19/2008  . Hepatitis A 09/09/2012, 10/09/2013  . Hepatitis A, Ped/Adol-2 Dose 11/05/2014  . Hepatitis B 03/31/1997, 05/20/1997, 03/05/1998  . HiB (PRP-OMP) 06/26/1997, 09/28/1997, 12/03/1997, 08/30/1998  . IPV 06/26/1997, 09/28/1997, 08/30/1998, 07/01/2001  . Influenza Nasal 05/21/2008, 06/13/2009, 09/09/2012  . Influenza Whole 05/09/2007, 08/28/2011  . Influenza,inj,Quad PF,36+ Mos 05/04/2014, 08/16/2015  . MMR 08/30/1998, 07/01/2001  . Meningococcal Conjugate 09/09/2012, 10/09/2013  . Td 02/10/2009  . Tdap 02/10/2009  . Varicella 08/30/1998, 11/05/2014    HPI 21 year old female patient with medical history as above. She is being seen in f/u for hyperlipidemia requested by McDonell, Kyra Manges, MD. - Reportedly, she was diagnosed with hyperlipidemia at age 26, previously treated with Lipitor until it was  stopped more than a year ago. -She is not currently on any antidepressant medications. She denies any history of coronary artery disease. She has family history of hyperlipidemia in her mother who also denies history of coronary artery disease. - She does not take any particular precaution on her diet, consumes butter and fried food freely. -She is not particularly active physically and stays mostly sedentary. She denies smoking, alcohol abuse. She is on is on combination OCP. -She  denies any body rashes/lesions related to her cholesterol level. - She does not have a plan to get pregnant in the immediate future.  Review of Systems  Constitutional: + Steady weight, no fatigue, no subjective hyperthermia, no subjective hypothermia Eyes: no blurry vision, no xerophthalmia ENT: no sore throat, no nodules palpated in throat, no dysphagia/odynophagia, no hoarseness Cardiovascular: no Chest  Pain, no Shortness of Breath, no palpitations, no leg swelling Respiratory: no cough, no SOB Gastrointestinal: no Nausea/Vomiting/Diarhhea Musculoskeletal: no muscle/joint aches Skin: no rashes Neurological: no tremors, no numbness, no tingling, no dizziness Psychiatric: + depression, no anxiety  Objective:    BP (!) 106/59   Pulse 98   Ht 5' 8.13" (1.731 m)   Wt 186 lb (84.4 kg)   BMI 28.17 kg/m   Wt Readings from Last 3 Encounters:  12/14/16 186 lb (84.4 kg) (96 %, Z= 1.71)*  12/04/16 187 lb (84.8 kg) (96 %, Z= 1.73)*  10/16/16 191 lb 6.4 oz (86.8 kg) (96 %, Z= 1.81)*   * Growth percentiles are based on CDC 2-20 Years data.    Physical Exam  Constitutional: over weight for hight, not in acute distress, normal state of mind Eyes: PERRLA, EOMI, no exophthalmos ENT: moist mucous membranes, no thyromegaly, no cervical lymphadenopathy Cardiovascular: normal precordial activity, Regular Rate and Rhythm, no Murmur/Rubs/Gallops Respiratory:  adequate breathing efforts, no gross chest deformity, Clear to auscultation bilaterally Gastrointestinal: abdomen soft, Non -tender, No distension, Bowel Sounds present Musculoskeletal: no gross deformities, strength intact in all four extremities Skin: moist, warm, no rashes Neurological: no tremor with outstretched hands, Deep tendon reflexes normal in all four extremities.  CMP ( most recent) CMP     Component Value Date/Time   NA 139 12/04/2016 1106   K 5.0 12/04/2016 1106   CL 104 12/04/2016 1106   CO2 26 12/04/2016 1106   GLUCOSE 78 12/04/2016 1106   BUN 8 12/04/2016 1106   CREATININE 1.10 (H) 12/04/2016 1106   CALCIUM 9.7 12/04/2016 1106   PROT 6.7 12/04/2016 1106   ALBUMIN 4.3 12/04/2016 1106   AST 17 12/04/2016 1106   ALT 16 12/04/2016 1106   ALKPHOS 59 12/04/2016 1106   BILITOT 0.7 12/04/2016 1106   GFRNONAA NOT CALCULATED 06/06/2012 0639   GFRAA NOT CALCULATED 06/06/2012 0639     Diabetic Labs (most recent): Lab  Results  Component Value Date   HGBA1C 4.7 12/04/2016   HGBA1C 5.1 05/27/2015   HGBA1C 5.0 09/11/2014     Lipid Panel ( most recent) Lipid Panel     Component Value Date/Time   CHOL 250 (H) 12/04/2016 1106   TRIG 90 (H) 12/04/2016 1106   HDL 59 12/04/2016 1106   CHOLHDL 4.2 12/04/2016 1106   VLDL 18 12/04/2016 1106   LDLCALC 173 (H) 12/04/2016 1106      Assessment & Plan:   1. Mixed hyperlipidemia - Patient is being seen at the kind request of  McDonell, Kyra Manges, MD. I have reviewed her available records and clinically evaluated this patient. - Her repeat lipid panel shows worsening of her lipid profile, with LDL at 173, triglycerides at 90, total cholesterol 250, HDL at 59. 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 - Although statins in general are not studied in pediatric age group, it is necessary for her at this time. - I have discussed and initiated low-dose rosuvastatin, 5 mg by mouth daily at bedtime, to  target LDL to goal of <100. - I have had a long discussion regarding  diet and exercise with her and her mother in the room. She will need repeat fasting lipid panel in 3 months with office visit. -Her thyroid function test within normal limits, A1c is 4.7% showing absence of diabetes, vitamin D's replete at 42, no sign of fatty liver at this time. - I advised patient to maintain close follow up with McDonell, Kyra Manges, MD for primary care needs. Follow up plan: Return in about 3 months (around 03/16/2017) for follow up with pre-visit labs.  Glade Lloyd, MD Phone: [phone]  Fax: [phone]   12/14/2016, 9:49 AM

## 2017-01-20 ENCOUNTER — Encounter (HOSPITAL_COMMUNITY): Payer: Self-pay | Admitting: *Deleted

## 2017-01-20 DIAGNOSIS — N764 Abscess of vulva: Secondary | ICD-10-CM | POA: Diagnosis not present

## 2017-01-20 DIAGNOSIS — Z7722 Contact with and (suspected) exposure to environmental tobacco smoke (acute) (chronic): Secondary | ICD-10-CM | POA: Insufficient documentation

## 2017-01-20 NOTE — ED Triage Notes (Signed)
Pt c/o abscess to left groin x 2 days; pt denies any drainage

## 2017-01-21 ENCOUNTER — Emergency Department (HOSPITAL_COMMUNITY)
Admission: EM | Admit: 2017-01-21 | Discharge: 2017-01-21 | Disposition: A | Discharge status: Home / Self Care | Payer: Medicaid Other | Attending: Emergency Medicine | Admitting: Emergency Medicine

## 2017-01-21 ENCOUNTER — Other Ambulatory Visit: Payer: Self-pay | Admitting: Pediatrics

## 2017-01-21 DIAGNOSIS — L0291 Cutaneous abscess, unspecified: Secondary | ICD-10-CM

## 2017-01-21 MED ORDER — LIDOCAINE HCL (PF) 2 % IJ SOLN
10.0000 mL | Freq: Once | INTRAMUSCULAR | Status: AC
  Administered 2017-01-21: 10 mL
  Filled 2017-01-21: qty 10

## 2017-01-21 MED ORDER — SULFAMETHOXAZOLE-TRIMETHOPRIM 800-160 MG PO TABS: 1.0000 | ORAL_TABLET | Freq: Two times a day (BID) | ORAL | 0 refills | Status: DC

## 2017-01-21 MED ORDER — LIDOCAINE-EPINEPHRINE-TETRACAINE (LET) SOLUTION
3.0000 mL | Freq: Once | NASAL | Status: AC
  Administered 2017-01-21: 3 mL via TOPICAL
  Filled 2017-01-21: qty 3

## 2017-01-21 MED ORDER — SULFAMETHOXAZOLE-TRIMETHOPRIM 800-160 MG PO TABS
1.0000 | ORAL_TABLET | Freq: Once | ORAL | Status: AC
  Administered 2017-01-21: 1 via ORAL
  Filled 2017-01-21: qty 1

## 2017-01-21 MED ORDER — OXYCODONE-ACETAMINOPHEN 5-325 MG PO TABS
1.0000 | ORAL_TABLET | Freq: Once | ORAL | Status: AC
  Administered 2017-01-21: 1 via ORAL
  Filled 2017-01-21: qty 1

## 2017-01-21 NOTE — ED Provider Notes (Signed)
[facility] DEPT Provider Note   CSN: [id number] Arrival date & time: 01/20/17  2252     History   Chief Complaint Chief Complaint  Patient presents with  . Abscess    HPI Tracy [name] is a 21 y.o. female.   Abscess  Location:  Pelvis Pelvic abscess location:  Vulva Size:  2x3cm Abscess quality: induration, painful, redness and warmth   Abscess quality: not draining   Red streaking: no   Duration:  2 days Progression:  Worsening Pain details:    Quality:  Throbbing and sharp   Severity:  Moderate   Duration:  2 days   Timing:  Constant   Progression:  Worsening Chronicity:  New   Past Medical History:  Diagnosis Date  . ADHD (attention deficit hyperactivity disorder)   . Bipolar disorder (HCC)   . Obesity   . Oppositional defiant disorder   . Seasonal allergies     Patient Active Problem List   Diagnosis Date Noted  . HLD (hyperlipidemia) 04/14/2013  . Bipolar affective disorder, depressed, severe (HCC) 06/05/2012  . ADHD (attention deficit hyperactivity disorder), combined type 06/13/2011  . ODD (oppositional defiant disorder) 06/13/2011    Past Surgical History:  Procedure Laterality Date  . ADENOIDECTOMY WITH MYRINGOTOMY    . corrective surgery for strabismus     left eye    OB History    No data available       Home Medications    Prior to Admission medications   Medication Sig Start Date End Date Taking? Authorizing Provider  Azelastine HCl (ASTEPRO) 0.15 % SOLN USE 2 SPRAYS INTO EACH NOSTRIL DAILY 03/06/16   McDonell, Alfredia ClientMary Jo, MD  buPROPion (WELLBUTRIN XL) 300 MG 24 hr tablet Take 1 tablet (300 mg total) by mouth daily. 12/06/16   Myrlene Brokeross, Deborah R, MD  cetirizine (ZYRTEC) 10 MG tablet TAKE 1 TABLET(10 MG) BY MOUTH DAILY 03/06/16   McDonell, Alfredia ClientMary Jo, MD  CRESTOR 5 MG tablet Take 1 tablet (5 mg total) by mouth at bedtime. 12/14/16   Roma KayserNida, Gebreselassie W, MD  montelukast (SINGULAIR) 10 MG tablet TAKE 1 TABLET BY MOUTH EVERY DAY  10/27/16   McDonell, Alfredia ClientMary Jo, MD  polyethylene glycol [facility](MIRALAX / GLYCOLAX) packet Take 17 g by mouth daily. Reported on 08/16/2015    [provider]  risperiDONE (RISPERDAL) 0.5 MG tablet Take 1 tablet (0.5 mg total) by mouth 2 (two) times daily. 12/06/16   Myrlene Brokeross, Deborah R, MD  sulfamethoxazole-trimethoprim (BACTRIM DS,SEPTRA DS) 800-160 MG tablet Take 1 tablet by mouth 2 (two) times daily. 01/21/17   Gilda CreasePollina, Christopher J, MD  traZODone (DESYREL) 100 MG tablet Take 2 tablets (200 mg total) by mouth at bedtime. 12/06/16   Myrlene Brokeross, Deborah R, MD  TRINESSA LO 0.18/0.215/0.25 MG-25 MCG tab TAKE 1 TABLET BY MOUTH EVERY DAY 10/30/16   McDonell, Alfredia ClientMary Jo, MD    Family History Family History  Problem Relation Age of Onset  . Bipolar disorder Sister   . ADD / ADHD Sister   . Bipolar disorder Maternal Grandmother     Social History Social History  Substance Use Topics  . Smoking status: Passive Smoke Exposure - Never Smoker  . Smokeless tobacco: Never Used  . Alcohol use No     Allergies   Patient has no known allergies.   Review of Systems Review of Systems  Skin: Positive for color change.  All other systems reviewed and are negative.    Physical Exam Updated Vital Signs BP Marland Kitchen(!)  101/58 (BP Location: Right Arm)   Pulse (!) 103   Temp 98.9 F (37.2 C) (Oral)   Resp 18   Ht 5' 8.5" (1.74 m)   Wt 82.6 kg (182 lb)   LMP 12/28/2016   SpO2 98%   BMI 27.27 kg/m   Physical Exam  Constitutional: She is oriented to person, place, and time. She appears well-developed and well-nourished. No distress.  HENT:  Head: Normocephalic and atraumatic.  Right Ear: Hearing normal.  Left Ear: Hearing normal.  Nose: Nose normal.  Mouth/Throat: Oropharynx is clear and moist and mucous membranes are normal.  Eyes: Conjunctivae and EOM are normal. Pupils are equal, round, and reactive to light.  Neck: Normal range of motion. Neck supple.  Cardiovascular: Regular rhythm, S1 normal and S2  normal.  Exam reveals no gallop and no friction rub.   No murmur heard. Pulmonary/Chest: Effort normal and breath sounds normal. No respiratory distress. She exhibits no tenderness.  Abdominal: Soft. Normal appearance and bowel sounds are normal. There is no hepatosplenomegaly. There is no tenderness. There is no rebound, no guarding, no tenderness at McBurney's point and negative Murphy's sign. No hernia.  Genitourinary:    There is lesion on the left labia.  Musculoskeletal: Normal range of motion.  Neurological: She is alert and oriented to person, place, and time. She has normal strength. No cranial nerve deficit or sensory deficit. Coordination normal. GCS eye subscore is 4. GCS verbal subscore is 5. GCS motor subscore is 6.  Skin: Skin is warm, dry and intact. No rash noted. No cyanosis.  Psychiatric: She has a normal mood and affect. Her speech is normal and behavior is normal. Thought content normal.  Nursing note and vitals reviewed.    ED Treatments / Results  Labs (all labs ordered are listed, but only abnormal results are displayed) Labs Reviewed - No data to display  EKG  EKG Interpretation None       Radiology No results found.  Procedures .Marland KitchenIncision and Drainage Date/Time: 01/21/2017 3:11 AM Performed by: Gilda Crease Authorized by: Gilda Crease   Consent:    Consent obtained:  Verbal   Consent given by:  Patient   Risks discussed:  Bleeding, incomplete drainage and pain Universal protocol:    Procedure explained and questions answered to patient or proxy's satisfaction: yes     Site/side marked: yes     Immediately prior to procedure a time out was called: yes     Patient identity confirmed:  Verbally with patient Location:    Type:  Abscess   Size:  2x3cm   Location:  Anogenital   Anogenital location:  Vulva Pre-procedure details:    Skin preparation:  Chloraprep Anesthesia (see MAR for exact dosages):    Anesthesia method:   Topical application and local infiltration   Topical anesthetic:  LET   Local anesthetic:  Lidocaine 2% w/o epi Procedure type:    Complexity:  Simple Procedure details:    Needle aspiration: no     Incision types:  Single straight   Incision depth:  Dermal   Scalpel blade:  11   Wound management:  Probed and deloculated and irrigated with saline   Drainage:  Purulent   Drainage amount:  Copious   Wound treatment:  Wound left open Post-procedure details:    Patient tolerance of procedure:  Tolerated well, no immediate complications   (including critical care time)  Medications Ordered in ED Medications  lidocaine (XYLOCAINE) 2 % injection 10  mL (not administered)  sulfamethoxazole-trimethoprim (BACTRIM DS,SEPTRA DS) 800-160 MG per tablet 1 tablet (not administered)  oxyCODONE-acetaminophen (PERCOCET/ROXICET) 5-325 MG per tablet 1 tablet (1 tablet Oral Given 01/21/17 0220)  lidocaine-EPINEPHrine-tetracaine (LET) solution (3 mLs Topical Given 01/21/17 0220)     Initial Impression / Assessment and Plan / ED Course  I have reviewed the triage vital signs and the nursing notes.  Pertinent labs & imaging results that were available during my care of the patient were reviewed by me and considered in my medical decision making (see chart for details).       Final Clinical Impressions(s) / ED Diagnoses   Final diagnoses:  Abscess    New Prescriptions Current Discharge Medication List    START taking these medications   Details  sulfamethoxazole-trimethoprim (BACTRIM DS,SEPTRA DS) 800-160 MG tablet Take 1 tablet by mouth 2 (two) times daily. Qty: 20 tablet, Refills: 0         Pollina, Canary Brim, MD 01/21/17 [phone]

## 2017-03-05 ENCOUNTER — Ambulatory Visit (INDEPENDENT_AMBULATORY_CARE_PROVIDER_SITE_OTHER): Payer: Medicaid Other | Admitting: Psychiatry

## 2017-03-05 ENCOUNTER — Encounter (HOSPITAL_COMMUNITY): Payer: Self-pay | Admitting: Psychiatry

## 2017-03-05 VITALS — BP 87/58 | HR 82 | Ht 68.5 in | Wt 177.0 lb

## 2017-03-05 DIAGNOSIS — Z560 Unemployment, unspecified: Secondary | ICD-10-CM | POA: Diagnosis not present

## 2017-03-05 DIAGNOSIS — Z818 Family history of other mental and behavioral disorders: Secondary | ICD-10-CM | POA: Diagnosis not present

## 2017-03-05 DIAGNOSIS — F39 Unspecified mood [affective] disorder: Secondary | ICD-10-CM

## 2017-03-05 MED ORDER — TRAZODONE HCL 100 MG PO TABS: 200.0000 mg | ORAL_TABLET | Freq: Every day | ORAL | 3 refills | Status: DC

## 2017-03-05 MED ORDER — RISPERIDONE 0.5 MG PO TABS: 0.5000 mg | ORAL_TABLET | Freq: Two times a day (BID) | ORAL | 3 refills | Status: DC

## 2017-03-05 MED ORDER — BUPROPION HCL ER (XL) 300 MG PO TB24: 300.0000 mg | ORAL_TABLET | Freq: Every day | ORAL | 2 refills | Status: DC
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
## 2017-03-20 ENCOUNTER — Ambulatory Visit: Payer: Medicaid Other | Admitting: "Endocrinology

## 2017-03-21 ENCOUNTER — Other Ambulatory Visit: Payer: Self-pay | Admitting: "Endocrinology

## 2017-03-21 LAB — LIPID PANEL
CHOL/HDL RATIO: 2.4 ratio (ref <5.0)
CHOLESTEROL: 161 mg/dL (ref <170)
HDL: 67 mg/dL (ref >45)
LDL Cholesterol: 82 mg/dL (ref <110)
Triglycerides: 61 mg/dL (ref <90)
VLDL: 12 mg/dL (ref <30)

## 2017-03-28 ENCOUNTER — Ambulatory Visit (INDEPENDENT_AMBULATORY_CARE_PROVIDER_SITE_OTHER): Payer: Medicaid Other | Admitting: "Endocrinology

## 2017-03-28 ENCOUNTER — Encounter: Payer: Self-pay | Admitting: "Endocrinology

## 2017-03-28 VITALS — BP 101/67 | HR 98 | Ht 68.13 in | Wt 178.0 lb

## 2017-03-28 DIAGNOSIS — E782 Mixed hyperlipidemia: Secondary | ICD-10-CM | POA: Diagnosis not present

## 2017-03-28 NOTE — Progress Notes (Signed)
Subjective:    Patient ID: Tracy [name], female    DOB: May 20, 1996, PCP McDonell, Kyra Manges, MD   Past Medical History:  Diagnosis Date  . ADHD (attention deficit hyperactivity disorder)   . Bipolar disorder ([place])   . Obesity   . Oppositional defiant disorder   . Seasonal allergies    Past Surgical History:  Procedure Laterality Date  . ADENOIDECTOMY WITH MYRINGOTOMY    . corrective surgery for strabismus     left eye   Social History   Social History  . Marital status: Single    Spouse name: N/A  . Number of children: N/A  . Years of education: N/A   Occupational History  . Student Unemployed    9th grade at [employer] History Main Topics  . Smoking status: Passive Smoke Exposure - Never Smoker  . Smokeless tobacco: Never Used  . Alcohol use No  . Drug use: No  . Sexual activity: No   Other Topics Concern  . None   Social History Narrative   Lives with mom and younger sister, will be attending college this year   Outpatient Encounter Prescriptions as of 03/28/2017  Medication Sig  . Azelastine HCl (ASTEPRO) 0.15 % SOLN USE 2 SPRAYS INTO EACH NOSTRIL DAILY  . buPROPion (WELLBUTRIN XL) 300 MG 24 hr tablet Take 1 tablet (300 mg total) by mouth daily.  . cetirizine (ZYRTEC) 10 MG tablet TAKE 1 TABLET BY MOUTH EVERY DAY  . montelukast (SINGULAIR) 10 MG tablet TAKE 1 TABLET BY MOUTH EVERY DAY  . polyethylene glycol (MIRALAX / GLYCOLAX) packet Take 17 g by mouth daily. Reported on 08/16/2015  . risperiDONE (RISPERDAL) 0.5 MG tablet Take 1 tablet (0.5 mg total) by mouth 2 (two) times daily.  . traZODone (DESYREL) 100 MG tablet Take 2 tablets (200 mg total) by mouth at bedtime.  . TRINESSA LO 0.18/0.215/0.25 MG-25 MCG tab TAKE 1 TABLET BY MOUTH EVERY DAY  . [DISCONTINUED] CRESTOR 5 MG tablet Take 1 tablet (5 mg total) by mouth at bedtime.  . [DISCONTINUED] sulfamethoxazole-trimethoprim (BACTRIM DS,SEPTRA DS) 800-160 MG tablet Take 1 tablet by mouth  2 (two) times daily.   No facility-administered encounter medications on file as of 03/28/2017.    ALLERGIES: No Known Allergies  VACCINATION STATUS: Immunization History  Administered Date(s) Administered  . DTaP 06/26/1997, 09/28/1997, 12/03/1997, 10/06/1998, 06/30/2001  . HPV Quadrivalent 05/09/2007, 08/05/2007, 01/19/2008  . Hepatitis A 09/09/2012, 10/09/2013  . Hepatitis A, Ped/Adol-2 Dose 11/05/2014  . Hepatitis B 03/31/1997, 05/20/1997, 03/05/1998  . HiB (PRP-OMP) 06/26/1997, 09/28/1997, 12/03/1997, 08/30/1998  . IPV 06/26/1997, 09/28/1997, 08/30/1998, 07/01/2001  . Influenza Nasal 05/21/2008, 06/13/2009, 09/09/2012  . Influenza Whole 05/09/2007, 08/28/2011  . Influenza,inj,Quad PF,6+ Mos 05/04/2014, 08/16/2015  . MMR 08/30/1998, 07/01/2001  . Meningococcal Conjugate 09/09/2012, 10/09/2013  . Td 02/10/2009  . Tdap 02/10/2009  . Varicella 08/30/1998, 11/05/2014    HPI 21 year old female patient with medical history as above. She is being seen in f/u for hyperlipidemia. - She is currently on Crestor 5 mg po qhs, tolerating well. - Reportedly, she was diagnosed with hyperlipidemia at age 89, previously treated with Lipitor . -She denies any history of coronary artery disease. She has family history of hyperlipidemia in her mother who also denies history of coronary artery disease. - She has modified her diet to less butter and fried food lately. She lost 13 lbs overall. -She is not particularly active physically and stays mostly sedentary. She denies smoking,  alcohol abuse. She is on is on combination OCP. -She denies any body rashes/lesions related to her cholesterol level. - She does not have a plan to get pregnant in the immediate future.  Review of Systems  Constitutional: + Steady weight loss, no fatigue, no subjective hyperthermia, no subjective hypothermia Eyes: no blurry vision, no xerophthalmia ENT: no sore throat, no nodules palpated in throat, no  dysphagia/odynophagia, no hoarseness Cardiovascular: no Chest Pain, no Shortness of Breath, no palpitations, no leg swelling Respiratory: no cough, no SOB Gastrointestinal: no Nausea/Vomiting/Diarhhea Musculoskeletal: no muscle/joint aches Skin: no rashes Neurological: no tremors, no numbness, no tingling, no dizziness Psychiatric: + depression, no anxiety  Objective:    BP 101/67   Pulse 98   Ht 5' 8.13" (1.731 m)   Wt 178 lb (80.7 kg)   BMI 26.96 kg/m   Wt Readings from Last 3 Encounters:  03/28/17 178 lb (80.7 kg) (94 %, Z= 1.55)*  01/20/17 182 lb (82.6 kg) (95 %, Z= 1.63)*  12/14/16 186 lb (84.4 kg) (96 %, Z= 1.71)*   * Growth percentiles are based on CDC 2-20 Years data.    Physical Exam  Constitutional: over weight for hight, not in acute distress, normal state of mind Eyes: PERRLA, EOMI, no exophthalmos ENT: moist mucous membranes, no thyromegaly, no cervical lymphadenopathy Cardiovascular: normal precordial activity, Regular Rate and Rhythm, no Murmur/Rubs/Gallops Respiratory:  adequate breathing efforts, no gross chest deformity, Clear to auscultation bilaterally Gastrointestinal: abdomen soft, Non -tender, No distension, Bowel Sounds present Musculoskeletal: no gross deformities, strength intact in all four extremities Skin: moist, warm, no rashes Neurological: no tremor with outstretched hands, Deep tendon reflexes normal in all four extremities.  CMP ( most recent) CMP     Component Value Date/Time   NA 139 12/04/2016 1106   K 5.0 12/04/2016 1106   CL 104 12/04/2016 1106   CO2 26 12/04/2016 1106   GLUCOSE 78 12/04/2016 1106   BUN 8 12/04/2016 1106   CREATININE 1.10 (H) 12/04/2016 1106   CALCIUM 9.7 12/04/2016 1106   PROT 6.7 12/04/2016 1106   ALBUMIN 4.3 12/04/2016 1106   AST 17 12/04/2016 1106   ALT 16 12/04/2016 1106   ALKPHOS 59 12/04/2016 1106   BILITOT 0.7 12/04/2016 1106   GFRNONAA NOT CALCULATED 06/06/2012 0639   GFRAA NOT CALCULATED  06/06/2012 0639     Diabetic Labs (most recent): Lab Results  Component Value Date   HGBA1C 4.7 12/04/2016   HGBA1C 5.1 05/27/2015   HGBA1C 5.0 09/11/2014     Lipid Panel ( most recent) Lipid Panel     Component Value Date/Time   CHOL 161 03/21/2017 0927   TRIG 61 03/21/2017 0927   HDL 67 03/21/2017 0927   CHOLHDL 2.4 03/21/2017 0927   VLDL 12 03/21/2017 0927   LDLCALC 82 03/21/2017 0927     Lipid Panel     Component Value Date/Time   CHOL 161 03/21/2017 0927   TRIG 61 03/21/2017 0927   HDL 67 03/21/2017 0927   CHOLHDL 2.4 03/21/2017 0927   VLDL 12 03/21/2017 0927   LDLCALC 82 03/21/2017 0927    Assessment & Plan:   1. Mixed hyperlipidemia  - Her repeat lipid panel shows significant improvement to target.  She has lost 13 lbs overall. 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 - Statins are not studies well in pediatric age group, crestor has been used safely.  - I have had a long discussion regarding diet and exercise with her. - We  planned to stop Crestor and repeal labs in 6 months.   Crestor will be reconsidered if she loses control.  -Her thyroid function test within normal limits, A1c is 4.7% showing absence of diabetes, vitamin D's replete at 42, no sign of fatty liver at this time. - I advised patient to maintain close follow up with McDonell, Kyra Manges, MD for primary care needs. Follow up plan: Return in about 6 months (around 09/26/2017) for follow up with pre-visit labs.  Glade Lloyd, MD Phone: [phone]  Fax: [phone]  This note was partially dictated with voice recognition software. Similar sounding words can be transcribed inadequately or may not  be corrected upon review.  03/28/2017, 12:33 PM

## 2017-04-25 ENCOUNTER — Other Ambulatory Visit: Payer: Self-pay | Admitting: Pediatrics

## 2017-05-29 ENCOUNTER — Other Ambulatory Visit: Payer: Self-pay | Admitting: Pediatrics

## 2017-06-05 ENCOUNTER — Encounter (HOSPITAL_COMMUNITY): Payer: Self-pay | Admitting: Psychiatry

## 2017-06-05 ENCOUNTER — Ambulatory Visit (INDEPENDENT_AMBULATORY_CARE_PROVIDER_SITE_OTHER): Payer: Medicaid Other | Admitting: Psychiatry

## 2017-06-05 VITALS — BP 100/62 | Ht 68.13 in | Wt 177.4 lb

## 2017-06-05 DIAGNOSIS — R454 Irritability and anger: Secondary | ICD-10-CM

## 2017-06-05 DIAGNOSIS — F39 Unspecified mood [affective] disorder: Secondary | ICD-10-CM

## 2017-06-05 DIAGNOSIS — F819 Developmental disorder of scholastic skills, unspecified: Secondary | ICD-10-CM

## 2017-06-05 DIAGNOSIS — R451 Restlessness and agitation: Secondary | ICD-10-CM | POA: Diagnosis not present

## 2017-06-05 DIAGNOSIS — Z560 Unemployment, unspecified: Secondary | ICD-10-CM | POA: Diagnosis not present

## 2017-06-05 DIAGNOSIS — Z818 Family history of other mental and behavioral disorders: Secondary | ICD-10-CM

## 2017-06-05 MED ORDER — TRAZODONE HCL 100 MG PO TABS: 200.0000 mg | ORAL_TABLET | Freq: Every day | ORAL | 3 refills | Status: DC

## 2017-06-05 MED ORDER — BUPROPION HCL ER (XL) 300 MG PO TB24: 300.0000 mg | ORAL_TABLET | Freq: Every day | ORAL | 2 refills | Status: DC

## 2017-06-05 MED ORDER — RISPERIDONE 0.5 MG PO TABS: 0.5000 mg | ORAL_TABLET | Freq: Two times a day (BID) | ORAL | 3 refills | Status: DC
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
## 2017-09-26 ENCOUNTER — Other Ambulatory Visit: Payer: Self-pay | Admitting: "Endocrinology

## 2017-09-26 DIAGNOSIS — E1165 Type 2 diabetes mellitus with hyperglycemia: Secondary | ICD-10-CM

## 2017-09-26 DIAGNOSIS — E785 Hyperlipidemia, unspecified: Secondary | ICD-10-CM

## 2017-09-27 LAB — LIPID PANEL
Cholesterol: 222 mg/dL — ABNORMAL HIGH (ref <200)
HDL: 57 mg/dL (ref >50)
LDL Cholesterol (Calc): 143 mg/dL (calc) — ABNORMAL HIGH
NON-HDL CHOLESTEROL (CALC): 165 mg/dL — AB (ref <130)
TRIGLYCERIDES: 102 mg/dL (ref <150)
Total CHOL/HDL Ratio: 3.9 (calc) (ref <5.0)

## 2017-09-27 LAB — COMPREHENSIVE METABOLIC PANEL
AG RATIO: 1.5 (calc) (ref 1.0–2.5)
ALT: 13 U/L (ref 6–29)
AST: 17 U/L (ref 10–30)
Albumin: 3.9 g/dL (ref 3.6–5.1)
Alkaline phosphatase (APISO): 63 U/L (ref 33–115)
BILIRUBIN TOTAL: 0.5 mg/dL (ref 0.2–1.2)
BUN: 9 mg/dL (ref 7–25)
CALCIUM: 9.5 mg/dL (ref 8.6–10.2)
CHLORIDE: 105 mmol/L (ref 98–110)
CO2: 29 mmol/L (ref 20–32)
Creat: 1.08 mg/dL (ref 0.50–1.10)
GLOBULIN: 2.6 g/dL (ref 1.9–3.7)
Glucose, Bld: 77 mg/dL (ref 65–99)
Potassium: 4.7 mmol/L (ref 3.5–5.3)
Sodium: 139 mmol/L (ref 135–146)
TOTAL PROTEIN: 6.5 g/dL (ref 6.1–8.1)

## 2017-09-27 LAB — HEMOGLOBIN A1C
Hgb A1c MFr Bld: 4.8 % of total Hgb (ref <5.7)
MEAN PLASMA GLUCOSE: 91 (calc)
eAG (mmol/L): 5 (calc)

## 2017-10-01 ENCOUNTER — Ambulatory Visit (INDEPENDENT_AMBULATORY_CARE_PROVIDER_SITE_OTHER): Payer: Medicaid Other | Admitting: "Endocrinology

## 2017-10-01 ENCOUNTER — Encounter: Payer: Self-pay | Admitting: "Endocrinology

## 2017-10-01 VITALS — BP 102/80 | Ht 68.13 in | Wt 181.0 lb

## 2017-10-01 DIAGNOSIS — E782 Mixed hyperlipidemia: Secondary | ICD-10-CM | POA: Diagnosis not present

## 2017-10-01 MED ORDER — ROSUVASTATIN CALCIUM 5 MG PO TABS: 5.0000 mg | ORAL_TABLET | Freq: Every day | ORAL | 3 refills | Status: DC

## 2017-10-01 NOTE — Progress Notes (Signed)
Subjective:    Patient ID: Tracy [name], female    DOB: [birth year], PCP McDonell, Kyra Manges, MD   Past Medical History:  Diagnosis Date  . ADHD (attention deficit hyperactivity disorder)   . Bipolar disorder ([place])   . Obesity   . Oppositional defiant disorder   . Seasonal allergies    Past Surgical History:  Procedure Laterality Date  . ADENOIDECTOMY WITH MYRINGOTOMY    . corrective surgery for strabismus     left eye   Social History   Socioeconomic History  . Marital status: Single    Spouse name: None  . Number of children: None  . Years of education: None  . Highest education level: None  Social Needs  . Financial resource strain: None  . Food insecurity - worry: None  . Food insecurity - inability: None  . Transportation needs - medical: None  . Transportation needs - non-medical: None  Occupational History  . Occupation: Lexicographer: UNEMPLOYED    Comment: 9th grade at [employer] Use  . Smoking status: Passive Smoke Exposure - Never Smoker  . Smokeless tobacco: Never Used  Substance and Sexual Activity  . Alcohol use: No  . Drug use: No  . Sexual activity: No    Birth control/protection: Pill  Other Topics Concern  . None  Social History Narrative   Lives with mom and younger sister, will be attending college this year   Outpatient Encounter Medications as of 10/01/2017  Medication Sig  . Azelastine HCl (ASTEPRO) 0.15 % SOLN USE 2 SPRAYS INTO EACH NOSTRIL DAILY  . buPROPion (WELLBUTRIN XL) 300 MG 24 hr tablet Take 1 tablet (300 mg total) daily by mouth.  . cetirizine (ZYRTEC) 10 MG tablet TAKE 1 TABLET BY MOUTH EVERY DAY  . montelukast (SINGULAIR) 10 MG tablet TAKE 1 TABLET BY MOUTH EVERY DAY  . polyethylene glycol (MIRALAX / GLYCOLAX) packet Take 17 g by mouth daily. Reported on 08/16/2015  . risperiDONE (RISPERDAL) 0.5 MG tablet Take 1 tablet (0.5 mg total) 2 (two) times daily by mouth.  . rosuvastatin (CRESTOR) 5 MG  tablet Take 1 tablet (5 mg total) by mouth daily.  . traZODone (DESYREL) 100 MG tablet Take 2 tablets (200 mg total) at bedtime by mouth.  Bertram Millard LO 0.18/0.215/0.25 MG-25 MCG tab TAKE 1 TABLET BY MOUTH EVERY DAY   No facility-administered encounter medications on file as of 10/01/2017.    ALLERGIES: No Known Allergies  VACCINATION STATUS: Immunization History  Administered Date(s) Administered  . DTaP 06/26/1997, 09/28/1997, 12/03/1997, 10/06/1998, 06/30/2001  . HPV Quadrivalent 05/09/2007, 08/05/2007, 01/19/2008  . Hepatitis A 09/09/2012, 10/09/2013  . Hepatitis A, Ped/Adol-2 Dose 11/05/2014  . Hepatitis B 03/31/1997, 05/20/1997, 03/05/1998  . HiB (PRP-OMP) 06/26/1997, 09/28/1997, 12/03/1997, 08/30/1998  . IPV 06/26/1997, 09/28/1997, 08/30/1998, 07/01/2001  . Influenza Nasal 05/21/2008, 06/13/2009, 09/09/2012  . Influenza Whole 05/09/2007, 08/28/2011  . Influenza,inj,Quad PF,6+ Mos 05/04/2014, 08/16/2015  . MMR 08/30/1998, 07/01/2001  . Meningococcal Conjugate 09/09/2012, 10/09/2013  . Td 02/10/2009  . Tdap 02/10/2009  . Varicella 08/30/1998, 11/05/2014    HPI 22 year old female patient with medical history as above. She is being seen in f/u for hyperlipidemia. - She required treatment with low-dose Crestor prior to her last visit when this was discontinued due to her significant and fast response dropping her LDL from 173 to 82 . -Since her last visit, she has reversed the  13 lbs weight loss she has achieved.    -  Reportedly, she was diagnosed with hyperlipidemia at age 34, previously treated with Lipitor . -She denies any history of coronary artery disease. She has family history of hyperlipidemia in her mother who also denies history of coronary artery disease.  -She is not particularly active physically and stays mostly sedentary. She denies smoking, alcohol abuse. She is on is on combination OCP. -She denies any body rashes/lesions related to her cholesterol level. -  She does not have a plan to get pregnant in the immediate future.  Review of Systems  Constitutional: + Steady weight gain, no fatigue, no subjective hyperthermia, no subjective hypothermia Eyes: no blurry vision, no xerophthalmia ENT: no sore throat, no nodules palpated in throat, no dysphagia/odynophagia, no hoarseness Cardiovascular: no Chest Pain, no Shortness of Breath, no palpitations, no leg swelling Respiratory: no cough, no SOB Gastrointestinal: no Nausea/Vomiting/Diarhhea Musculoskeletal: no muscle/joint aches Skin: No skin rash. Neurological: no tremors, no numbness, no tingling, no dizziness Psychiatric: + depression, no anxiety  Objective:    BP 102/80   Ht 5' 8.13" (1.731 m)   Wt 181 lb (82.1 kg)   BMI 27.42 kg/m   Wt Readings from Last 3 Encounters:  10/01/17 181 lb (82.1 kg)  03/28/17 178 lb (80.7 kg) (94 %, Z= 1.55)*  01/20/17 182 lb (82.6 kg) (95 %, Z= 1.63)*   * Growth percentiles are based on CDC (Girls, 2-20 Years) data.    Physical Exam  Constitutional: + Overweight for height, in acute distress,  normal state of mind Eyes: PERRLA, EOMI, no exophthalmos ENT: moist mucous membranes, no thyromegaly, no cervical lymphadenopathy Cardiovascular: normal precordial activity, Regular Rate and Rhythm, no Murmur/Rubs/Gallops Respiratory:  adequate breathing efforts, no gross chest deformity, Clear to auscultation bilaterally Gastrointestinal: abdomen soft, Non -tender, No distension, Bowel Sounds present Musculoskeletal: no gross deformities, strength intact in all four extremities Skin: moist, warm, no rashes Neurological: no tremor with outstretched hands, Deep tendon reflexes normal in all four extremities.  CMP ( most recent) CMP     Component Value Date/Time   NA 139 09/26/2017 1003   K 4.7 09/26/2017 1003   CL 105 09/26/2017 1003   CO2 29 09/26/2017 1003   GLUCOSE 77 09/26/2017 1003   BUN 9 09/26/2017 1003   CREATININE 1.08 09/26/2017 1003    CALCIUM 9.5 09/26/2017 1003   PROT 6.5 09/26/2017 1003   ALBUMIN 4.3 12/04/2016 1106   AST 17 09/26/2017 1003   ALT 13 09/26/2017 1003   ALKPHOS 59 12/04/2016 1106   BILITOT 0.5 09/26/2017 1003   GFRNONAA NOT CALCULATED 06/06/2012 0639   GFRAA NOT CALCULATED 06/06/2012 0639     Diabetic Labs (most recent): Lab Results  Component Value Date   HGBA1C 4.8 09/26/2017   HGBA1C 4.7 12/04/2016   HGBA1C 5.1 05/27/2015     Lipid Panel ( most recent) Lipid Panel     Component Value Date/Time   CHOL 222 (H) 09/26/2017 1003   TRIG 102 09/26/2017 1003   HDL 57 09/26/2017 1003   CHOLHDL 3.9 09/26/2017 1003   VLDL 12 03/21/2017 0927   LDLCALC 82 03/21/2017 0927     Lipid Panel     Component Value Date/Time   CHOL 222 (H) 09/26/2017 1003   TRIG 102 09/26/2017 1003   HDL 57 09/26/2017 1003   CHOLHDL 3.9 09/26/2017 1003   VLDL 12 03/21/2017 0927   LDLCALC 82 03/21/2017 0927    Assessment & Plan:   1. Mixed hyperlipidemia -After responding to low-dose Crestor with LDL dropping  from 173 to 82, on her current labs her LDL is higher at 143. -She does not have thyroid dysfunction nor diabetes. -She is reapproached for treatment with Crestor and she accepts. -I discussed and initiated Crestor 5 mg p.o. nightly, side effects and precautions discussed with her.  -Her thyroid function test within normal limits, A1c is 4.7% showing absence of diabetes, vitamin D's replete at 42, no sign of fatty liver at this time. - I advised patient to maintain close follow up with McDonell, Kyra Manges, MD for primary care needs. Follow up plan: Return in about 6 months (around 04/03/2018) for follow up with pre-visit labs.  Glade Lloyd, MD Phone: [phone]  Fax: [phone]  This note was partially dictated with voice recognition software. Similar sounding words can be transcribed inadequately or may not  be corrected upon review.  10/01/2017, 1:22 PM

## 2017-10-02 ENCOUNTER — Other Ambulatory Visit: Payer: Self-pay | Admitting: Pediatrics

## 2017-10-03 ENCOUNTER — Ambulatory Visit (INDEPENDENT_AMBULATORY_CARE_PROVIDER_SITE_OTHER): Payer: Medicaid Other | Admitting: Psychiatry

## 2017-10-03 ENCOUNTER — Encounter (HOSPITAL_COMMUNITY): Payer: Self-pay | Admitting: Psychiatry

## 2017-10-03 VITALS — BP 105/70 | HR 79 | Ht 68.0 in | Wt 181.0 lb

## 2017-10-03 DIAGNOSIS — Z818 Family history of other mental and behavioral disorders: Secondary | ICD-10-CM

## 2017-10-03 DIAGNOSIS — F39 Unspecified mood [affective] disorder: Secondary | ICD-10-CM

## 2017-10-03 DIAGNOSIS — F902 Attention-deficit hyperactivity disorder, combined type: Secondary | ICD-10-CM

## 2017-10-03 DIAGNOSIS — F79 Unspecified intellectual disabilities: Secondary | ICD-10-CM

## 2017-10-03 DIAGNOSIS — Z560 Unemployment, unspecified: Secondary | ICD-10-CM

## 2017-10-03 MED ORDER — TRAZODONE HCL 100 MG PO TABS
200.0000 mg | ORAL_TABLET | Freq: Every day | ORAL | 3 refills | Status: DC
Start: 2017-10-03 — End: 2018-01-06

## 2017-10-03 MED ORDER — RISPERIDONE 0.5 MG PO TABS: 0.5000 mg | ORAL_TABLET | Freq: Two times a day (BID) | ORAL | 3 refills | Status: DC

## 2017-10-03 MED ORDER — BUPROPION HCL ER (XL) 300 MG PO TB24
300.0000 mg | ORAL_TABLET | Freq: Every day | ORAL | 2 refills | Status: DC
Start: 2017-10-03 — End: 2018-01-06
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
## 2018-01-06 ENCOUNTER — Encounter (HOSPITAL_COMMUNITY): Payer: Self-pay | Admitting: Psychiatry

## 2018-01-06 ENCOUNTER — Ambulatory Visit (INDEPENDENT_AMBULATORY_CARE_PROVIDER_SITE_OTHER): Payer: Medicaid Other | Admitting: Psychiatry

## 2018-01-06 VITALS — BP 96/67 | HR 86 | Ht 68.0 in | Wt 181.0 lb

## 2018-01-06 DIAGNOSIS — F39 Unspecified mood [affective] disorder: Secondary | ICD-10-CM

## 2018-01-06 DIAGNOSIS — F902 Attention-deficit hyperactivity disorder, combined type: Secondary | ICD-10-CM

## 2018-01-06 DIAGNOSIS — Z818 Family history of other mental and behavioral disorders: Secondary | ICD-10-CM

## 2018-01-06 DIAGNOSIS — F79 Unspecified intellectual disabilities: Secondary | ICD-10-CM | POA: Diagnosis not present

## 2018-01-06 MED ORDER — TRAZODONE HCL 100 MG PO TABS: 200.0000 mg | ORAL_TABLET | Freq: Every day | ORAL | 3 refills | Status: DC

## 2018-01-06 MED ORDER — RISPERIDONE 0.5 MG PO TABS: 0.5000 mg | ORAL_TABLET | Freq: Two times a day (BID) | ORAL | 3 refills | Status: DC

## 2018-01-06 MED ORDER — BUPROPION HCL ER (XL) 300 MG PO TB24: 300.0000 mg | ORAL_TABLET | Freq: Every day | ORAL | 2 refills | Status: DC
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
## 2018-03-21 LAB — CBC WITH DIFFERENTIAL/PLATELET
BASOS ABS: 40 {cells}/uL (ref 0–200)
Basophils Relative: 1.2 %
EOS ABS: 69 {cells}/uL (ref 15–500)
EOS PCT: 2.1 %
HEMATOCRIT: 36.7 % (ref 35.0–45.0)
HEMOGLOBIN: 12.4 g/dL (ref 11.7–15.5)
LYMPHS ABS: 1360 {cells}/uL (ref 850–3900)
MCH: 31.6 pg (ref 27.0–33.0)
MCHC: 33.8 g/dL (ref 32.0–36.0)
MCV: 93.4 fL (ref 80.0–100.0)
MPV: 11.3 fL (ref 7.5–12.5)
Monocytes Relative: 7.9 %
NEUTROS ABS: 1571 {cells}/uL (ref 1500–7800)
Neutrophils Relative %: 47.6 %
Platelets: 229 10*3/uL (ref 140–400)
RBC: 3.93 10*6/uL (ref 3.80–5.10)
RDW: 11.5 % (ref 11.0–15.0)
Total Lymphocyte: 41.2 %
WBC: 3.3 10*3/uL — ABNORMAL LOW (ref 3.8–10.8)
WBCMIX: 261 {cells}/uL (ref 200–950)

## 2018-03-21 LAB — LIPID PANEL
CHOL/HDL RATIO: 2.7 (calc) (ref <5.0)
Cholesterol: 161 mg/dL (ref <200)
HDL: 59 mg/dL (ref >50)
LDL CHOLESTEROL (CALC): 89 mg/dL
NON-HDL CHOLESTEROL (CALC): 102 mg/dL (ref <130)
Triglycerides: 50 mg/dL (ref <150)

## 2018-03-21 LAB — TSH: TSH: 0.8 mIU/L

## 2018-03-21 LAB — T4, FREE: Free T4: 1.4 ng/dL (ref 0.8–1.4)

## 2018-04-03 ENCOUNTER — Ambulatory Visit (INDEPENDENT_AMBULATORY_CARE_PROVIDER_SITE_OTHER): Payer: Medicaid Other | Admitting: "Endocrinology

## 2018-04-03 ENCOUNTER — Encounter: Payer: Self-pay | Admitting: "Endocrinology

## 2018-04-03 VITALS — BP 100/69 | HR 84 | Ht 68.0 in | Wt 178.8 lb

## 2018-04-03 DIAGNOSIS — E782 Mixed hyperlipidemia: Secondary | ICD-10-CM

## 2018-04-03 MED ORDER — ROSUVASTATIN CALCIUM 5 MG PO TABS: 5.0000 mg | ORAL_TABLET | Freq: Every day | ORAL | 3 refills | Status: DC

## 2018-04-03 NOTE — Progress Notes (Signed)
Endocrinology follow-up note    Subjective:    Patient ID: Tracy [name], female    DOB: March 04, 1996, PCP McDonell, Kyra Manges, MD   Past Medical History:  Diagnosis Date  . ADHD (attention deficit hyperactivity disorder)   . Bipolar disorder ([place])   . Obesity   . Oppositional defiant disorder   . Seasonal allergies    Past Surgical History:  Procedure Laterality Date  . ADENOIDECTOMY WITH MYRINGOTOMY    . corrective surgery for strabismus     left eye   Social History   Socioeconomic History  . Marital status: Single    Spouse name: Not on file  . Number of children: Not on file  . Years of education: Not on file  . Highest education level: Not on file  Occupational History  . Occupation: Lexicographer: UNEMPLOYED    Comment: 9th grade at [employer]  . Financial resource strain: Not on file  . Food insecurity:    Worry: Not on file    Inability: Not on file  . Transportation needs:    Medical: Not on file    Non-medical: Not on file  Tobacco Use  . Smoking status: Passive Smoke Exposure - Never Smoker  . Smokeless tobacco: Never Used  Substance and Sexual Activity  . Alcohol use: No  . Drug use: No  . Sexual activity: Never    Birth control/protection: Pill  Lifestyle  . Physical activity:    Days per week: Not on file    Minutes per session: Not on file  . Stress: Not on file  Relationships  . Social connections:    Talks on phone: Not on file    Gets together: Not on file    Attends religious service: Not on file    Active member of club or organization: Not on file    Attends meetings of clubs or organizations: Not on file    Relationship status: Not on file  Other Topics Concern  . Not on file  Social History Narrative   Lives with mom and younger sister, will be attending college this year   Outpatient Encounter Medications as of 04/03/2018  Medication Sig  . Azelastine HCl (ASTEPRO) 0.15 % SOLN USE 2 SPRAYS INTO  EACH NOSTRIL DAILY  . buPROPion (WELLBUTRIN XL) 300 MG 24 hr tablet Take 1 tablet (300 mg total) by mouth daily.  . cetirizine (ZYRTEC) 10 MG tablet TAKE 1 TABLET BY MOUTH EVERY DAY  . montelukast (SINGULAIR) 10 MG tablet TAKE 1 TABLET BY MOUTH EVERY DAY  . polyethylene glycol (MIRALAX / GLYCOLAX) packet Take 17 g by mouth daily. Reported on 08/16/2015  . risperiDONE (RISPERDAL) 0.5 MG tablet Take 1 tablet (0.5 mg total) by mouth 2 (two) times daily.  . rosuvastatin (CRESTOR) 5 MG tablet Take 1 tablet (5 mg total) by mouth daily.  . traZODone (DESYREL) 100 MG tablet Take 2 tablets (200 mg total) by mouth at bedtime.  . TRI-LO-SPRINTEC 0.18/0.215/0.25 MG-25 MCG tab TAKE 1 TABLET BY MOUTH EVERY DAY  . [DISCONTINUED] rosuvastatin (CRESTOR) 5 MG tablet Take 1 tablet (5 mg total) by mouth daily.   No facility-administered encounter medications on file as of 04/03/2018.    ALLERGIES: No Known Allergies  VACCINATION STATUS: Immunization History  Administered Date(s) Administered  . DTaP 06/26/1997, 09/28/1997, 12/03/1997, 10/06/1998, 06/30/2001  . HPV Quadrivalent 05/09/2007, 08/05/2007, 01/19/2008  . Hepatitis A 09/09/2012, 10/09/2013  . Hepatitis A, Ped/Adol-2 Dose 11/05/2014  .  Hepatitis B 03/31/1997, 05/20/1997, 03/05/1998  . HiB (PRP-OMP) 06/26/1997, 09/28/1997, 12/03/1997, 08/30/1998  . IPV 06/26/1997, 09/28/1997, 08/30/1998, 07/01/2001  . Influenza Nasal 05/21/2008, 06/13/2009, 09/09/2012  . Influenza Whole 05/09/2007, 08/28/2011  . Influenza,inj,Quad PF,6+ Mos 05/04/2014, 08/16/2015  . MMR 08/30/1998, 07/01/2001  . Meningococcal Conjugate 09/09/2012, 10/09/2013  . Td 02/10/2009  . Tdap 02/10/2009  . Varicella 08/30/1998, 11/05/2014    HPI 22 year old female  with medical history as above. She is being seen in follow-up for mixed hyperlipidemia.   - She  has required treatment with low-dose Crestor, which helped lower her LDL from 1 73- to 89.   -She is tolerating this medication  very well.  - Reportedly, she was diagnosed with hyperlipidemia at age 63, previously treated with Lipitor . -She denies any history of coronary artery disease. She has family history of hyperlipidemia in her mother who also denies history of coronary artery disease.  -She is not particularly active physically and stays mostly sedentary. She denies smoking, alcohol abuse. She is on is on combination OCP. -She denies any body rashes/lesions related to her cholesterol level. - She does not have a plan to get pregnant in the immediate future.  Review of Systems  Constitutional: + Steady weight gain, no fatigue, no subjective hyperthermia, no subjective hypothermia Eyes: no blurry vision, no xerophthalmia ENT: no sore throat, no nodules palpated in throat, no dysphagia/odynophagia, no hoarseness Gastrointestinal: no Nausea/Vomiting/Diarhhea Musculoskeletal: no muscle/joint aches Skin: No skin rash. Neurological: no tremors, no numbness, no tingling, no dizziness Psychiatric: + depression, no anxiety  Objective:    BP 100/69   Pulse 84   Ht _0  (1.727 m)   Wt 178 lb 12.8 oz (81.1 kg)   SpO2 96%   BMI 27.19 kg/m   Wt Readings from Last 3 Encounters:  04/03/18 178 lb 12.8 oz (81.1 kg)  10/01/17 181 lb (82.1 kg)  03/28/17 178 lb (80.7 kg) (94 %, Z= 1.55)*   * Growth percentiles are based on CDC (Girls, 2-20 Years) data.    Physical Exam  Constitutional: + Overweight for height, in acute distress,  normal state of mind Eyes: PERRLA, EOMI, no exophthalmos ENT: moist mucous membranes, no thyromegaly, no cervical lymphadenopathy Musculoskeletal: no gross deformities, strength intact in all four extremities Skin: moist, warm, no rashes Neurological: no tremor with outstretched hands, Deep tendon reflexes normal in all four extremities.  CMP ( most recent) CMP     Component Value Date/Time   NA 139 09/26/2017 1003   K 4.7 09/26/2017 1003   CL 105 09/26/2017 1003   CO2 29  09/26/2017 1003   GLUCOSE 77 09/26/2017 1003   BUN 9 09/26/2017 1003   CREATININE 1.08 09/26/2017 1003   CALCIUM 9.5 09/26/2017 1003   PROT 6.5 09/26/2017 1003   ALBUMIN 4.3 12/04/2016 1106   AST 17 09/26/2017 1003   ALT 13 09/26/2017 1003   ALKPHOS 59 12/04/2016 1106   BILITOT 0.5 09/26/2017 1003   GFRNONAA NOT CALCULATED 06/06/2012 0639   GFRAA NOT CALCULATED 06/06/2012 0639     Diabetic Labs (most recent): Lab Results  Component Value Date   HGBA1C 4.8 09/26/2017   HGBA1C 4.7 12/04/2016   HGBA1C 5.1 05/27/2015     Lipid Panel ( most recent) Lipid Panel     Component Value Date/Time   CHOL 161 03/21/2018 0918   TRIG 50 03/21/2018 0918   HDL 59 03/21/2018 0918   CHOLHDL 2.7 03/21/2018 0918   VLDL 12 03/21/2017 2353  [place] 89 03/21/2018 0918      Assessment & Plan:   1. Mixed hyperlipidemia -She has responded to low-dose Crestor lowering her LDL to 89. -She does not have thyroid dysfunction nor diabetes. -She will continue to benefit from antilipid treatment.    -I discussed and continued  Crestor 5 mg p.o. nightly, side effects and precautions discussed with her.  -Her thyroid function test within normal limits, A1c is 4.7% showing absence of diabetes, vitamin D's replete at 42, no sign of fatty liver at this time.  - I advised patient to maintain close follow up with McDonell, Kyra Manges, MD for primary care needs. Follow up plan: Return in about 6 months (around 10/02/2018) for Follow up with Pre-visit Labs.  Glade Lloyd, MD Phone: [phone]  Fax: [phone]  This note was partially dictated with voice recognition software. Similar sounding words can be transcribed inadequately or may not  be corrected upon review.  04/03/2018, 1:11 PM

## 2018-04-08 ENCOUNTER — Ambulatory Visit (INDEPENDENT_AMBULATORY_CARE_PROVIDER_SITE_OTHER): Payer: Medicaid Other | Admitting: Psychiatry

## 2018-04-08 ENCOUNTER — Encounter (HOSPITAL_COMMUNITY): Payer: Self-pay | Admitting: Psychiatry

## 2018-04-08 VITALS — BP 96/64 | HR 78 | Ht 68.0 in | Wt 180.0 lb

## 2018-04-08 DIAGNOSIS — R451 Restlessness and agitation: Secondary | ICD-10-CM

## 2018-04-08 DIAGNOSIS — Z560 Unemployment, unspecified: Secondary | ICD-10-CM

## 2018-04-08 DIAGNOSIS — F902 Attention-deficit hyperactivity disorder, combined type: Secondary | ICD-10-CM | POA: Diagnosis not present

## 2018-04-08 DIAGNOSIS — Z818 Family history of other mental and behavioral disorders: Secondary | ICD-10-CM

## 2018-04-08 DIAGNOSIS — F39 Unspecified mood [affective] disorder: Secondary | ICD-10-CM

## 2018-04-08 MED ORDER — BUPROPION HCL ER (XL) 300 MG PO TB24: 300.0000 mg | ORAL_TABLET | Freq: Every day | ORAL | 2 refills | Status: DC

## 2018-04-08 MED ORDER — RISPERIDONE 0.5 MG PO TABS: 0.5000 mg | ORAL_TABLET | Freq: Two times a day (BID) | ORAL | 3 refills | Status: DC

## 2018-04-08 MED ORDER — TRAZODONE HCL 100 MG PO TABS: 200.0000 mg | ORAL_TABLET | Freq: Every day | ORAL | 3 refills | Status: DC
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
## 2018-05-28 ENCOUNTER — Encounter: Payer: Self-pay | Admitting: Pediatrics

## 2018-07-08 ENCOUNTER — Ambulatory Visit (HOSPITAL_COMMUNITY): Payer: Self-pay | Admitting: Psychiatry

## 2018-07-09 ENCOUNTER — Ambulatory Visit (HOSPITAL_COMMUNITY): Payer: Medicaid Other | Admitting: Psychiatry

## 2018-07-10 ENCOUNTER — Encounter (HOSPITAL_COMMUNITY): Payer: Self-pay | Admitting: Psychiatry

## 2018-07-10 ENCOUNTER — Ambulatory Visit (INDEPENDENT_AMBULATORY_CARE_PROVIDER_SITE_OTHER): Payer: Medicaid Other | Admitting: Psychiatry

## 2018-07-10 VITALS — BP 116/69 | HR 94 | Ht 68.0 in | Wt 181.0 lb

## 2018-07-10 DIAGNOSIS — F39 Unspecified mood [affective] disorder: Secondary | ICD-10-CM | POA: Diagnosis not present

## 2018-07-10 DIAGNOSIS — F902 Attention-deficit hyperactivity disorder, combined type: Secondary | ICD-10-CM

## 2018-07-10 MED ORDER — TRAZODONE HCL 100 MG PO TABS: 200.0000 mg | ORAL_TABLET | Freq: Every day | ORAL | 3 refills | Status: DC

## 2018-07-10 MED ORDER — BUPROPION HCL ER (XL) 300 MG PO TB24: 300.0000 mg | ORAL_TABLET | Freq: Every day | ORAL | 2 refills | Status: DC

## 2018-07-10 MED ORDER — RISPERIDONE 0.5 MG PO TABS: 0.5000 mg | ORAL_TABLET | Freq: Two times a day (BID) | ORAL | 3 refills | Status: DC
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
## 2018-09-29 LAB — LIPID PANEL
Cholesterol: 165 mg/dL (ref <200)
HDL: 60 mg/dL (ref >=50)
LDL CHOLESTEROL (CALC): 87 mg/dL
NON-HDL CHOLESTEROL (CALC): 105 mg/dL (ref <130)
TRIGLYCERIDES: 85 mg/dL (ref <150)
Total CHOL/HDL Ratio: 2.8 (calc) (ref <5.0)

## 2018-10-02 ENCOUNTER — Encounter (HOSPITAL_COMMUNITY): Payer: Self-pay | Admitting: Psychiatry

## 2018-10-02 ENCOUNTER — Ambulatory Visit (INDEPENDENT_AMBULATORY_CARE_PROVIDER_SITE_OTHER): Payer: Medicaid Other | Admitting: "Endocrinology

## 2018-10-02 ENCOUNTER — Encounter: Payer: Self-pay | Admitting: "Endocrinology

## 2018-10-02 ENCOUNTER — Ambulatory Visit (INDEPENDENT_AMBULATORY_CARE_PROVIDER_SITE_OTHER): Payer: Medicaid Other | Admitting: Psychiatry

## 2018-10-02 VITALS — BP 95/62 | HR 85 | Ht 68.0 in | Wt 189.6 lb

## 2018-10-02 VITALS — BP 92/57 | HR 75 | Ht 68.0 in | Wt 189.0 lb

## 2018-10-02 DIAGNOSIS — F39 Unspecified mood [affective] disorder: Secondary | ICD-10-CM

## 2018-10-02 DIAGNOSIS — F902 Attention-deficit hyperactivity disorder, combined type: Secondary | ICD-10-CM

## 2018-10-02 DIAGNOSIS — E782 Mixed hyperlipidemia: Secondary | ICD-10-CM | POA: Diagnosis not present

## 2018-10-02 MED ORDER — TRAZODONE HCL 100 MG PO TABS: 200.0000 mg | ORAL_TABLET | Freq: Every day | ORAL | 3 refills | Status: DC

## 2018-10-02 MED ORDER — ROSUVASTATIN CALCIUM 5 MG PO TABS: 5.0000 mg | ORAL_TABLET | Freq: Every day | ORAL | 3 refills | Status: DC

## 2018-10-02 MED ORDER — RISPERIDONE 0.5 MG PO TABS: 0.5000 mg | ORAL_TABLET | Freq: Two times a day (BID) | ORAL | 3 refills | Status: DC

## 2018-10-02 MED ORDER — BUPROPION HCL ER (XL) 300 MG PO TB24: 300.0000 mg | ORAL_TABLET | Freq: Every day | ORAL | 2 refills | Status: DC
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
## 2018-10-02 NOTE — Progress Notes (Signed)
Tracy [name], CMA  

## 2018-10-02 NOTE — Patient Instructions (Signed)
                                       Advice for Weight Management  -For most of us the best way to lose weight is by diet management. Generally speaking, diet management means consuming less calories intentionally which over time brings about progressive weight loss.  This can be achieved more effectively by restricting carbohydrate consumption to the minimum possible.  More importantly, our carbohydrates sources should be unprocessed or minimally processed complex starch food items.   Sometimes, it is important to balance nutrition by increasing protein intake (animal or plant source), fruits, and vegetables.  -Sticking to a routine mealtime to eat 3 meals a day and avoiding unnecessary snacks is shown to have a big role in weight control.  -It is better to avoid simple carbohydrates including: Cakes, Sweet Desserts, Ice Cream, Soda (diet and regular), Sweet Tea, Candies, Chips, Cookies, Store Bought Juices, Alcohol in Excess of  1-2 drinks a day, Artificial Sweeteners, Doughnuts, Coffee Creamers, "Sugar-free" Products, etc, etc.  This is not a complete list.....    -Consulting with certified diabetes educators is proven to provide you with the most accurate and current information on diet.  Also, you may be  interested in discussing diet options/exchanges , we can schedule a visit with Penny Crumpton, RDN, CDE for individualized nutrition education.  -Exercise: If you are able: 30 -60 minutes a day ,4 days a week, or 150 minutes a week.  The longer the better.  Combine stretch, strength, and aerobic activities.  If you were told in the past that you have high risk for cardiovascular diseases, you may seek evaluation by your heart doctor prior to initiating moderate to intense exercise programs.     

## 2018-10-02 NOTE — Progress Notes (Signed)
Endocrinology follow-up note    Subjective:    Patient ID: Tracy [name], female    DOB: 1996/04/06, PCP McDonell, Kyra Manges, MD   Past Medical History:  Diagnosis Date  . ADHD (attention deficit hyperactivity disorder)   . Bipolar disorder ([place])   . Obesity   . Oppositional defiant disorder   . Seasonal allergies    Past Surgical History:  Procedure Laterality Date  . ADENOIDECTOMY WITH MYRINGOTOMY    . corrective surgery for strabismus     left eye   Social History   Socioeconomic History  . Marital status: Single    Spouse name: Not on file  . Number of children: Not on file  . Years of education: Not on file  . Highest education level: Not on file  Occupational History  . Occupation: Lexicographer: UNEMPLOYED    Comment: 9th grade at [employer]  . Financial resource strain: Not on file  . Food insecurity:    Worry: Not on file    Inability: Not on file  . Transportation needs:    Medical: Not on file    Non-medical: Not on file  Tobacco Use  . Smoking status: Passive Smoke Exposure - Never Smoker  . Smokeless tobacco: Never Used  Substance and Sexual Activity  . Alcohol use: No  . Drug use: No  . Sexual activity: Never    Birth control/protection: Pill  Lifestyle  . Physical activity:    Days per week: Not on file    Minutes per session: Not on file  . Stress: Not on file  Relationships  . Social connections:    Talks on phone: Not on file    Gets together: Not on file    Attends religious service: Not on file    Active member of club or organization: Not on file    Attends meetings of clubs or organizations: Not on file    Relationship status: Not on file  Other Topics Concern  . Not on file  Social History Narrative   Lives with mom and younger sister, will be attending college this year   Outpatient Encounter Medications as of 10/02/2018  Medication Sig  . Azelastine HCl (ASTEPRO) 0.15 % SOLN USE 2 SPRAYS INTO  EACH NOSTRIL DAILY  . buPROPion (WELLBUTRIN XL) 300 MG 24 hr tablet Take 1 tablet (300 mg total) by mouth daily.  . cetirizine (ZYRTEC) 10 MG tablet TAKE 1 TABLET BY MOUTH EVERY DAY  . montelukast (SINGULAIR) 10 MG tablet TAKE 1 TABLET BY MOUTH EVERY DAY  . polyethylene glycol (MIRALAX / GLYCOLAX) packet Take 17 g by mouth daily. Reported on 08/16/2015  . risperiDONE (RISPERDAL) 0.5 MG tablet Take 1 tablet (0.5 mg total) by mouth 2 (two) times daily.  . rosuvastatin (CRESTOR) 5 MG tablet Take 1 tablet (5 mg total) by mouth daily.  . traZODone (DESYREL) 100 MG tablet Take 2 tablets (200 mg total) by mouth at bedtime.  . TRI-LO-SPRINTEC 0.18/0.215/0.25 MG-25 MCG tab TAKE 1 TABLET BY MOUTH EVERY DAY  . [DISCONTINUED] rosuvastatin (CRESTOR) 5 MG tablet Take 1 tablet (5 mg total) by mouth daily.   No facility-administered encounter medications on file as of 10/02/2018.    ALLERGIES: No Known Allergies  VACCINATION STATUS: Immunization History  Administered Date(s) Administered  . DTaP 06/26/1997, 09/28/1997, 12/03/1997, 10/06/1998, 06/30/2001  . HPV Quadrivalent 05/09/2007, 08/05/2007, 01/19/2008  . Hepatitis A 09/09/2012, 10/09/2013  . Hepatitis A, Ped/Adol-2 Dose 11/05/2014  .  Hepatitis B 03/31/1997, 05/20/1997, 03/05/1998  . HiB (PRP-OMP) 06/26/1997, 09/28/1997, 12/03/1997, 08/30/1998  . IPV 06/26/1997, 09/28/1997, 08/30/1998, 07/01/2001  . Influenza Nasal 05/21/2008, 06/13/2009, 09/09/2012  . Influenza Whole 05/09/2007, 08/28/2011  . Influenza,inj,Quad PF,6+ Mos 05/04/2014, 08/16/2015  . MMR 08/30/1998, 07/01/2001  . Meningococcal Conjugate 09/09/2012, 10/09/2013  . Td 02/10/2009  . Tdap 02/10/2009  . Varicella 08/30/1998, 11/05/2014    Hyperlipidemia    23 year old female  with medical history as above. She is being seen in follow-up for hyperlipidemia.   -She has responded and staying on low-dose Crestor, currently 5 mg p.o. nightly.  Her LDL has dropped from 173 to 87. She  continues to tolerate her Crestor.  No complaints of myalgias or arthralgias.    - She  has required treatment with low-dose Crestor, which helped lower her LDL from 1 73- to 89.   -She is tolerating this medication very well.  - Reportedly, she was diagnosed with hyperlipidemia at age 73, previously treated with Lipitor . -She denies any history of coronary artery disease. She has family history of hyperlipidemia in her mother who also denies history of coronary artery disease.  -She is not particularly active physically and stays mostly sedentary.  She continues to gain weight.  She denies smoking, alcohol abuse. She is on is on combination OCP. -She denies any body rashes/lesions related to her cholesterol level. - She does not have a plan to get pregnant in the immediate future.  Review of Systems  Constitutional: + Progressive weight gain,  no fatigue, no subjective hyperthermia, no subjective hypothermia Eyes: no blurry vision, no xerophthalmia ENT: no sore throat, no goiter, no dysphagia, no odynophagia.   Gastrointestinal: no Nausea/Vomiting/Diarhhea Musculoskeletal: no muscle/joint aches Skin: No skin rash. Neurological: no tremors, no numbness, no tingling, no dizziness Psychiatric: + depression, no anxiety  Objective:    BP 95/62   Pulse 85   Ht 5' 8"  (1.727 m)   Wt 189 lb 9.6 oz (86 kg)   BMI 28.83 kg/m   Wt Readings from Last 3 Encounters:  10/02/18 189 lb 9.6 oz (86 kg)  04/03/18 178 lb 12.8 oz (81.1 kg)  10/01/17 181 lb (82.1 kg)    Physical Exam  Constitutional: + Overweight for height, not in acute distress.   normal state of mind Eyes: PERRLA, EOMI, no exophthalmos ENT: moist mucous membranes, no thyromegaly, no cervical lymphadenopathy Musculoskeletal: no gross deformities, strength intact in all four extremities Skin: moist, warm, no rashes Neurological: no tremor with outstretched hands, Deep tendon reflexes normal in all four extremities.  CMP ( most  recent) CMP     Component Value Date/Time   NA 139 09/26/2017 1003   K 4.7 09/26/2017 1003   CL 105 09/26/2017 1003   CO2 29 09/26/2017 1003   GLUCOSE 77 09/26/2017 1003   BUN 9 09/26/2017 1003   CREATININE 1.08 09/26/2017 1003   CALCIUM 9.5 09/26/2017 1003   PROT 6.5 09/26/2017 1003   ALBUMIN 4.3 12/04/2016 1106   AST 17 09/26/2017 1003   ALT 13 09/26/2017 1003   ALKPHOS 59 12/04/2016 1106   BILITOT 0.5 09/26/2017 1003   GFRNONAA NOT CALCULATED 06/06/2012 0639   GFRAA NOT CALCULATED 06/06/2012 0639     Diabetic Labs (most recent): Lab Results  Component Value Date   HGBA1C 4.8 09/26/2017   HGBA1C 4.7 12/04/2016   HGBA1C 5.1 05/27/2015     Lipid Panel ( most recent) Lipid Panel     Component Value Date/Time  CHOL 165 09/29/2018 0906   TRIG 85 09/29/2018 0906   HDL 60 09/29/2018 0906   CHOLHDL 2.8 09/29/2018 0906   VLDL 12 03/21/2017 0927   LDLCALC 87 09/29/2018 0906      Assessment & Plan:   1. Mixed hyperlipidemia -She has responded to low-dose Crestor lowering LDL from 173-87.  She continues to tolerate this regimen.  She is advised to continue Crestor 5 mg p.o. nightly, side effects and precautions discussed with her and her mother.    -Her thyroid function test within normal limits, A1c is 4.7% showing absence of diabetes/prediabetes, vitamin D's replete at 42, no sign of fatty liver at this time.  -We will return in 1 year with repeat labs including fasting lipid panel, A1c, thyroid function test.  - I advised patient to maintain close follow up with McDonell, Kyra Manges, MD for primary care needs. Follow up plan: Return in about 1 year (around 10/02/2019) for Follow up with Pre-visit Labs.  Glade Lloyd, MD Phone: [phone]  Fax: [phone]  This note was partially dictated with voice recognition software. Similar sounding words can be transcribed inadequately or may not  be corrected upon review.  10/02/2018, 10:30 AM

## 2019-01-02 ENCOUNTER — Ambulatory Visit (INDEPENDENT_AMBULATORY_CARE_PROVIDER_SITE_OTHER): Payer: Medicaid Other | Admitting: Psychiatry

## 2019-01-02 ENCOUNTER — Other Ambulatory Visit: Payer: Self-pay

## 2019-01-02 ENCOUNTER — Encounter (HOSPITAL_COMMUNITY): Payer: Self-pay | Admitting: Psychiatry

## 2019-01-02 DIAGNOSIS — F39 Unspecified mood [affective] disorder: Secondary | ICD-10-CM | POA: Diagnosis not present

## 2019-01-02 MED ORDER — TRAZODONE HCL 100 MG PO TABS: 200.0000 mg | ORAL_TABLET | Freq: Every day | ORAL | 3 refills | Status: DC

## 2019-01-02 MED ORDER — BUPROPION HCL ER (XL) 300 MG PO TB24: 300.0000 mg | ORAL_TABLET | Freq: Every day | ORAL | 2 refills | Status: DC

## 2019-01-02 MED ORDER — RISPERIDONE 0.5 MG PO TABS: 0.5000 mg | ORAL_TABLET | Freq: Two times a day (BID) | ORAL | 3 refills | Status: DC
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
## 2019-03-04 ENCOUNTER — Other Ambulatory Visit: Payer: Self-pay

## 2019-03-04 ENCOUNTER — Ambulatory Visit (INDEPENDENT_AMBULATORY_CARE_PROVIDER_SITE_OTHER): Payer: Medicaid Other | Admitting: Psychiatry

## 2019-03-04 ENCOUNTER — Encounter (HOSPITAL_COMMUNITY): Payer: Self-pay | Admitting: Psychiatry

## 2019-03-04 DIAGNOSIS — F39 Unspecified mood [affective] disorder: Secondary | ICD-10-CM

## 2019-03-04 DIAGNOSIS — G3184 Mild cognitive impairment, so stated: Secondary | ICD-10-CM

## 2019-03-04 DIAGNOSIS — G4700 Insomnia, unspecified: Secondary | ICD-10-CM

## 2019-03-04 DIAGNOSIS — F909 Attention-deficit hyperactivity disorder, unspecified type: Secondary | ICD-10-CM | POA: Diagnosis not present

## 2019-03-04 MED ORDER — TRAZODONE HCL 100 MG PO TABS: 200.0000 mg | ORAL_TABLET | Freq: Every day | ORAL | 3 refills | Status: DC

## 2019-03-04 MED ORDER — RISPERIDONE 0.5 MG PO TABS: 0.5000 mg | ORAL_TABLET | Freq: Two times a day (BID) | ORAL | 3 refills | Status: DC

## 2019-03-04 MED ORDER — BUPROPION HCL ER (XL) 300 MG PO TB24: 300.0000 mg | ORAL_TABLET | Freq: Every day | ORAL | 2 refills | Status: DC
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
## 2019-06-04 ENCOUNTER — Other Ambulatory Visit: Payer: Self-pay

## 2019-06-04 ENCOUNTER — Ambulatory Visit (INDEPENDENT_AMBULATORY_CARE_PROVIDER_SITE_OTHER): Payer: Medicaid Other | Admitting: Psychiatry

## 2019-06-04 ENCOUNTER — Encounter (HOSPITAL_COMMUNITY): Payer: Self-pay | Admitting: Psychiatry

## 2019-06-04 DIAGNOSIS — F902 Attention-deficit hyperactivity disorder, combined type: Secondary | ICD-10-CM

## 2019-06-04 DIAGNOSIS — F39 Unspecified mood [affective] disorder: Secondary | ICD-10-CM | POA: Diagnosis not present

## 2019-06-04 MED ORDER — RISPERIDONE 0.5 MG PO TABS: 0.5000 mg | ORAL_TABLET | Freq: Two times a day (BID) | ORAL | 3 refills | Status: DC

## 2019-06-04 MED ORDER — BUPROPION HCL ER (XL) 300 MG PO TB24: 300.0000 mg | ORAL_TABLET | Freq: Every day | ORAL | 2 refills | Status: DC

## 2019-06-04 MED ORDER — TRAZODONE HCL 100 MG PO TABS: 200.0000 mg | ORAL_TABLET | Freq: Every day | ORAL | 3 refills | Status: DC
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
## 2019-07-17 ENCOUNTER — Other Ambulatory Visit: Payer: Self-pay

## 2019-07-17 ENCOUNTER — Other Ambulatory Visit (HOSPITAL_COMMUNITY): Payer: Self-pay | Admitting: Adult Health Nurse Practitioner

## 2019-07-17 ENCOUNTER — Ambulatory Visit (HOSPITAL_COMMUNITY)
Admission: RE | Admit: 2019-07-17 | Discharge: 2019-07-17 | Disposition: A | Discharge status: Home / Self Care | Payer: Medicaid Other | Source: Ambulatory Visit | Attending: Adult Health Nurse Practitioner | Admitting: Adult Health Nurse Practitioner

## 2019-07-17 DIAGNOSIS — M545 Low back pain, unspecified: Secondary | ICD-10-CM

## 2019-09-04 ENCOUNTER — Telehealth (INDEPENDENT_AMBULATORY_CARE_PROVIDER_SITE_OTHER): Payer: Self-pay | Admitting: Psychiatry

## 2019-09-04 ENCOUNTER — Other Ambulatory Visit: Payer: Self-pay

## 2019-09-04 ENCOUNTER — Ambulatory Visit (HOSPITAL_COMMUNITY): Payer: Medicaid Other | Admitting: Psychiatry

## 2019-09-04 DIAGNOSIS — Z5329 Procedure and treatment not carried out because of patient's decision for other reasons: Secondary | ICD-10-CM

## 2019-09-04 DIAGNOSIS — Z91199 Patient's noncompliance with other medical treatment and regimen due to unspecified reason: Secondary | ICD-10-CM

## 2019-10-05 ENCOUNTER — Ambulatory Visit: Payer: Medicaid Other | Admitting: "Endocrinology

## 2020-01-07 ENCOUNTER — Other Ambulatory Visit: Payer: Self-pay

## 2020-01-07 ENCOUNTER — Telehealth (HOSPITAL_COMMUNITY): Payer: Medicaid Other | Admitting: Psychiatry

## 2020-02-19 ENCOUNTER — Other Ambulatory Visit (HOSPITAL_COMMUNITY): Payer: Self-pay | Admitting: Psychiatry

## 2020-02-20 NOTE — Telephone Encounter (Signed)
Call for appt

## 2020-02-22 NOTE — Telephone Encounter (Signed)
Message stated call can not be completed at this time x3 times

## 2020-02-23 ENCOUNTER — Encounter (HOSPITAL_COMMUNITY): Payer: Self-pay | Admitting: Psychiatry

## 2020-02-23 ENCOUNTER — Telehealth (INDEPENDENT_AMBULATORY_CARE_PROVIDER_SITE_OTHER): Payer: Medicaid Other | Admitting: Psychiatry

## 2020-02-23 ENCOUNTER — Other Ambulatory Visit: Payer: Self-pay

## 2020-02-23 DIAGNOSIS — F39 Unspecified mood [affective] disorder: Secondary | ICD-10-CM

## 2020-02-23 MED ORDER — RISPERIDONE 0.5 MG PO TABS: ORAL_TABLET | ORAL | 3 refills | Status: DC

## 2020-02-23 MED ORDER — BUPROPION HCL ER (XL) 300 MG PO TB24: 300.0000 mg | ORAL_TABLET | Freq: Every day | ORAL | 2 refills | Status: DC

## 2020-02-23 MED ORDER — TRAZODONE HCL 100 MG PO TABS: 200.0000 mg | ORAL_TABLET | Freq: Every day | ORAL | 3 refills | Status: DC
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
## 2020-02-29 ENCOUNTER — Telehealth (HOSPITAL_COMMUNITY): Payer: Self-pay

## 2020-02-29 NOTE — Telephone Encounter (Signed)
Medication management - Prior Authorization additional information form completed by Dr. Tenny Craw faxed back to [facility] pharmacy for review of patient's requested Risperdal order. Prior authorization decision pending for the Risperdal medication.

## 2020-04-14 ENCOUNTER — Other Ambulatory Visit (HOSPITAL_COMMUNITY): Payer: Self-pay | Admitting: Psychiatry

## 2020-06-21 ENCOUNTER — Telehealth (HOSPITAL_COMMUNITY): Payer: Medicaid Other | Admitting: Psychiatry

## 2020-06-24 ENCOUNTER — Telehealth (HOSPITAL_COMMUNITY): Payer: Medicaid Other | Admitting: Psychiatry

## 2020-06-28 ENCOUNTER — Telehealth (INDEPENDENT_AMBULATORY_CARE_PROVIDER_SITE_OTHER): Payer: Medicaid Other | Admitting: Psychiatry

## 2020-06-28 ENCOUNTER — Other Ambulatory Visit: Payer: Self-pay

## 2020-06-28 ENCOUNTER — Encounter (HOSPITAL_COMMUNITY): Payer: Self-pay | Admitting: Psychiatry

## 2020-06-28 DIAGNOSIS — F39 Unspecified mood [affective] disorder: Secondary | ICD-10-CM | POA: Diagnosis not present

## 2020-06-28 DIAGNOSIS — F902 Attention-deficit hyperactivity disorder, combined type: Secondary | ICD-10-CM

## 2020-06-28 MED ORDER — BUPROPION HCL ER (XL) 300 MG PO TB24: 300.0000 mg | ORAL_TABLET | Freq: Every day | ORAL | 2 refills | Status: DC

## 2020-06-28 MED ORDER — RISPERIDONE 0.5 MG PO TABS: ORAL_TABLET | ORAL | 3 refills | Status: DC

## 2020-06-28 MED ORDER — TRAZODONE HCL 100 MG PO TABS: 200.0000 mg | ORAL_TABLET | Freq: Every day | ORAL | 3 refills | Status: DC
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
## 2020-08-29 ENCOUNTER — Telehealth (HOSPITAL_COMMUNITY): Payer: Self-pay | Admitting: Psychiatry

## 2020-08-29 NOTE — Telephone Encounter (Signed)
Called to schedule f/u appt, left vm 

## 2020-09-23 IMAGING — DX DG THORACIC SPINE 3V
3 series · 3 of 3 positions shown · non-contrast
Comparison: 09/04/2011

CLINICAL DATA: Chronic thoracic back pain.  No known injury.

EXAM:
THORACIC SPINE - 3 VIEWS

[t-spine ap]
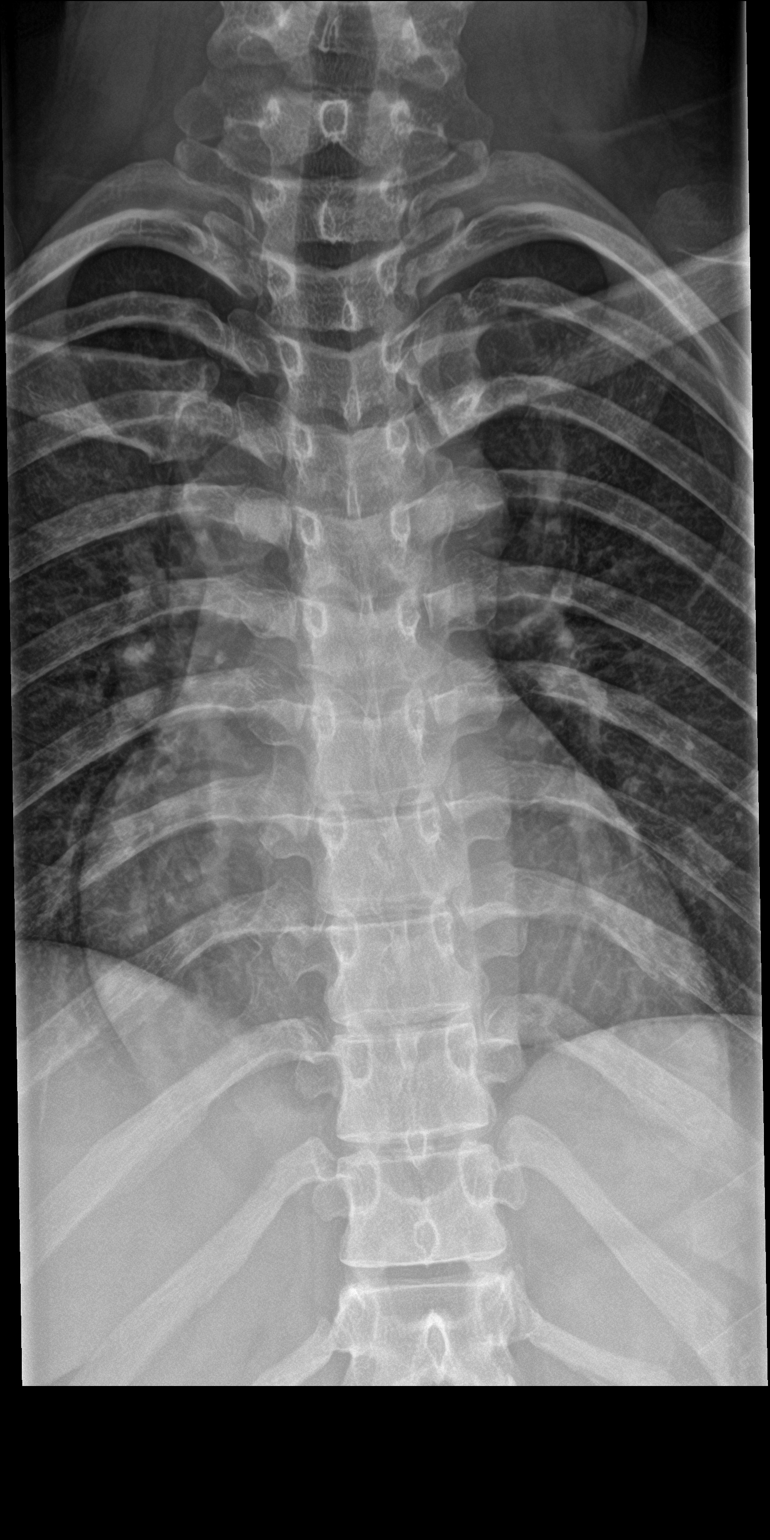

[t-spine lat]
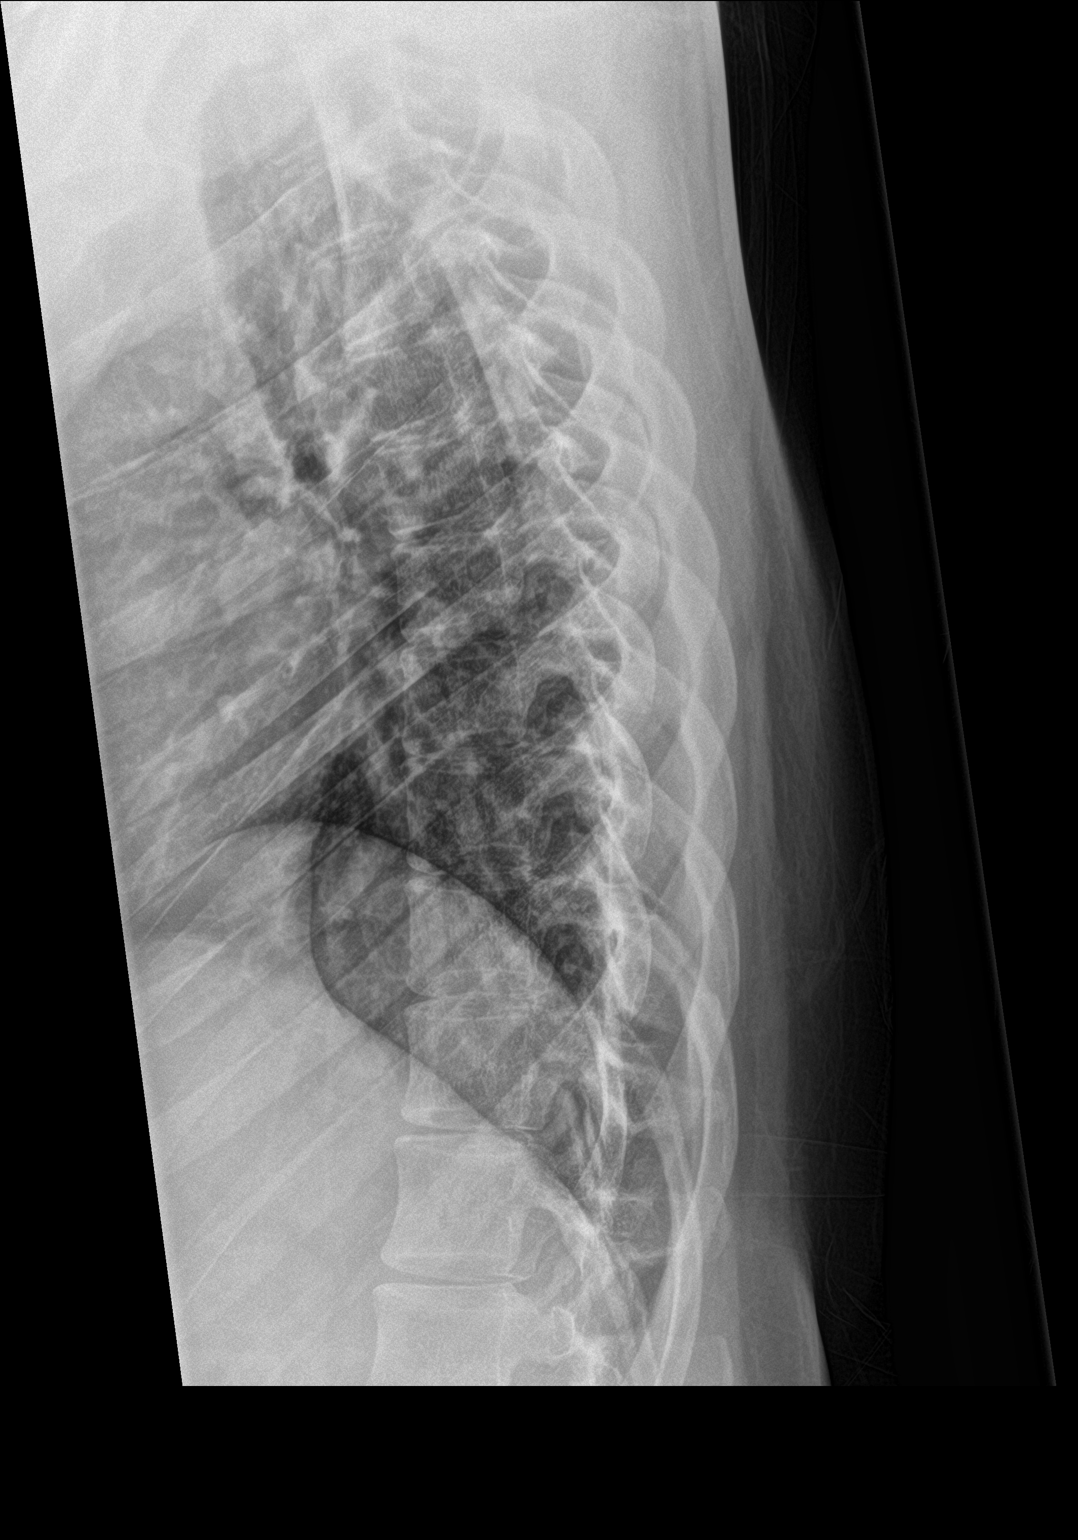

[t-spine swimmers]
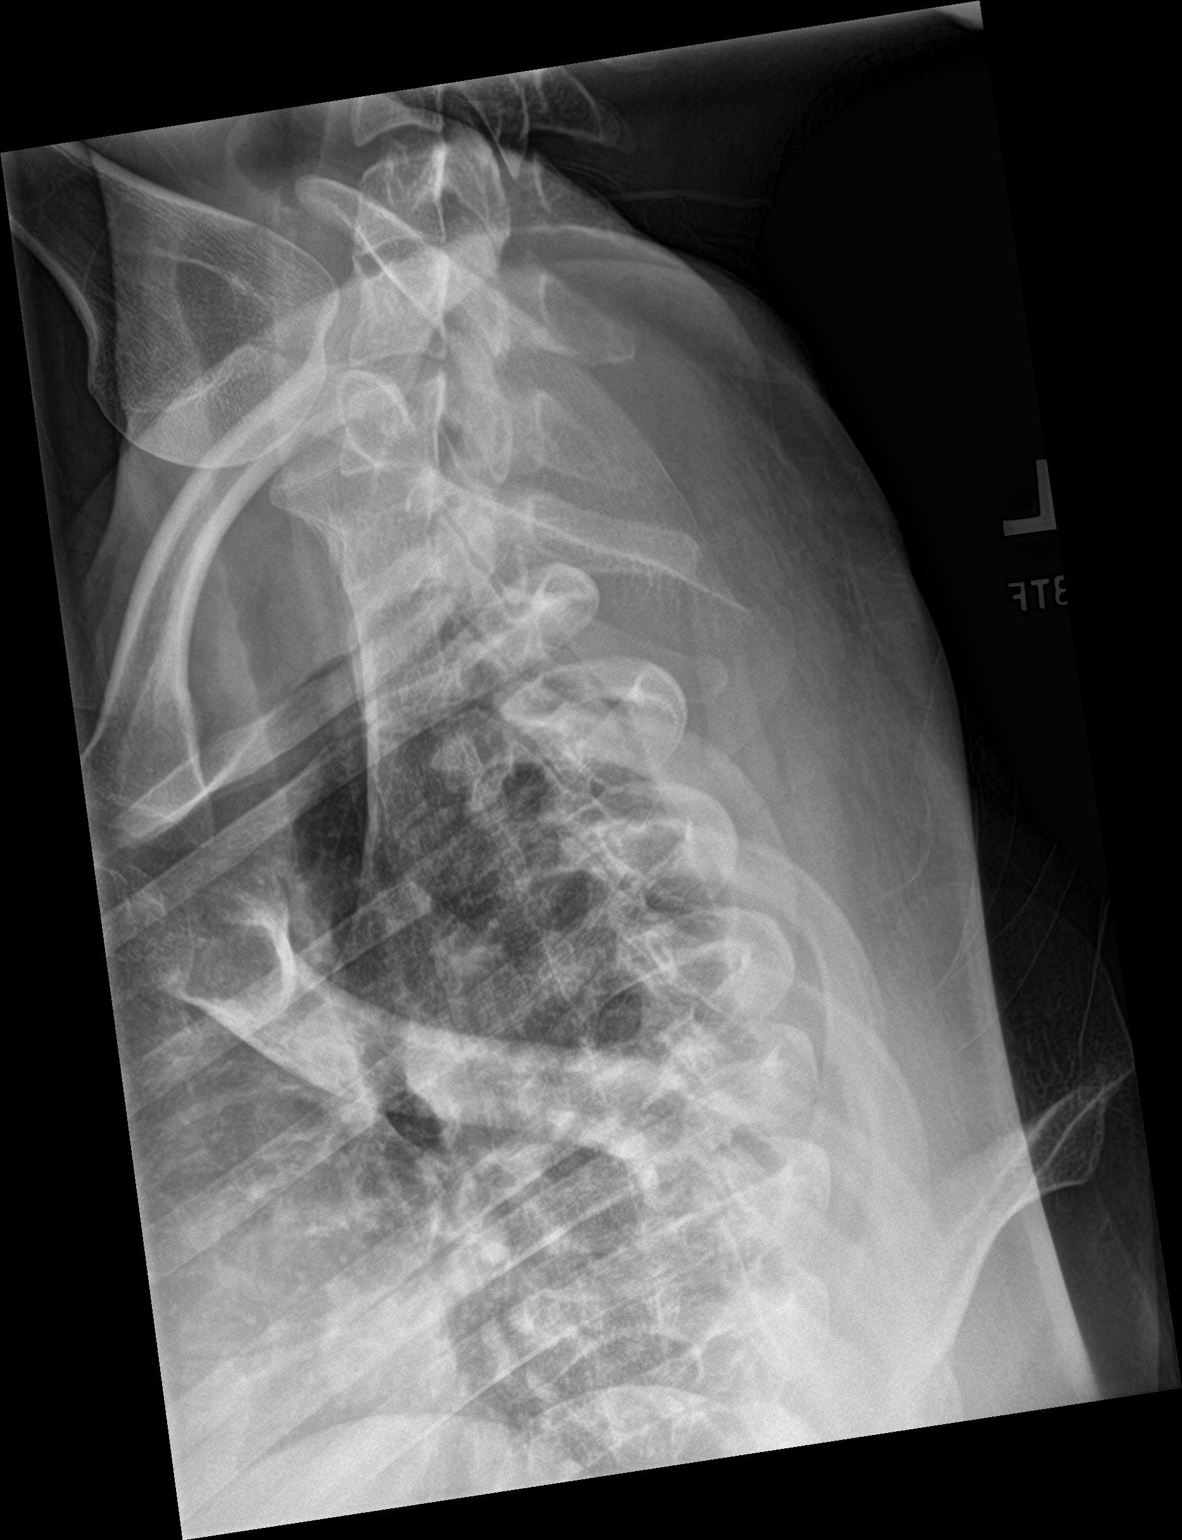

[3 of 3 positions shown; findings below may reference images not displayed]

FINDINGS: There is no evidence of thoracic spine fracture. Alignment is
normal. No other significant bone abnormalities are identified.
IMPRESSION: Negative.

## 2020-10-24 ENCOUNTER — Telehealth (INDEPENDENT_AMBULATORY_CARE_PROVIDER_SITE_OTHER): Payer: Medicaid Other | Admitting: Psychiatry

## 2020-10-24 ENCOUNTER — Encounter (HOSPITAL_COMMUNITY): Payer: Self-pay | Admitting: Psychiatry

## 2020-10-24 ENCOUNTER — Other Ambulatory Visit: Payer: Self-pay

## 2020-10-24 DIAGNOSIS — F39 Unspecified mood [affective] disorder: Secondary | ICD-10-CM | POA: Diagnosis not present

## 2020-10-24 DIAGNOSIS — F902 Attention-deficit hyperactivity disorder, combined type: Secondary | ICD-10-CM | POA: Diagnosis not present

## 2020-10-24 MED ORDER — TRAZODONE HCL 100 MG PO TABS: 200.0000 mg | ORAL_TABLET | Freq: Every day | ORAL | 3 refills | Status: DC

## 2020-10-24 MED ORDER — RISPERIDONE 0.5 MG PO TABS: ORAL_TABLET | ORAL | 3 refills | Status: DC

## 2020-10-24 MED ORDER — BUPROPION HCL ER (XL) 300 MG PO TB24: 300.0000 mg | ORAL_TABLET | Freq: Every day | ORAL | 2 refills | Status: DC
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
## 2021-01-17 ENCOUNTER — Telehealth (INDEPENDENT_AMBULATORY_CARE_PROVIDER_SITE_OTHER): Payer: Medicaid Other | Admitting: Psychiatry

## 2021-01-17 ENCOUNTER — Encounter (HOSPITAL_COMMUNITY): Payer: Self-pay | Admitting: Psychiatry

## 2021-01-17 ENCOUNTER — Other Ambulatory Visit: Payer: Self-pay

## 2021-01-17 DIAGNOSIS — F39 Unspecified mood [affective] disorder: Secondary | ICD-10-CM

## 2021-01-17 DIAGNOSIS — F902 Attention-deficit hyperactivity disorder, combined type: Secondary | ICD-10-CM

## 2021-01-17 MED ORDER — TRAZODONE HCL 100 MG PO TABS: 200.0000 mg | ORAL_TABLET | Freq: Every day | ORAL | 3 refills | Status: DC

## 2021-01-17 MED ORDER — RISPERIDONE 0.5 MG PO TABS: ORAL_TABLET | ORAL | 3 refills | Status: DC

## 2021-01-17 MED ORDER — BUPROPION HCL ER (XL) 300 MG PO TB24: 300.0000 mg | ORAL_TABLET | Freq: Every day | ORAL | 3 refills | Status: DC
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
## 2021-02-23 ENCOUNTER — Telehealth (HOSPITAL_COMMUNITY): Payer: Self-pay | Admitting: *Deleted

## 2021-02-23 NOTE — Telephone Encounter (Signed)
Patient mother Blain Pais) called stating she will be coming to pick up formes they dropped of for Dr. Tenny Craw to complete. Per pt mother, office do not have to fax it to law office they will hand deliver the forms because pt don't need to provide release.

## 2021-02-23 NOTE — Telephone Encounter (Signed)
Forms brought to office by patient and her mother for provider to complete. Top of the form states Work Tax inspector. (Mental) with a Lawyer office card attached to the form. Lawyer office name is "[employer] [place]. Margo Aye, IV and their number is [phone] and fax number is [phone].   Staff called patient and her mother and informed them to reach out to [employer] to sign Release of Information with them before staff can Fax over the form that Dr. Tenny Craw completed.   Forms will be placed in folder that states FMLA and Misc Paperwork in front office until office receives release to fax forms to law office.

## 2021-04-05 NOTE — Telephone Encounter (Signed)
No Show Called pt for telemed, number not in service.

## 2021-05-01 ENCOUNTER — Other Ambulatory Visit: Payer: Self-pay

## 2021-05-01 ENCOUNTER — Encounter (HOSPITAL_COMMUNITY): Payer: Self-pay | Admitting: Psychiatry

## 2021-05-01 ENCOUNTER — Telehealth (INDEPENDENT_AMBULATORY_CARE_PROVIDER_SITE_OTHER): Payer: Medicaid Other | Admitting: Psychiatry

## 2021-05-01 DIAGNOSIS — F39 Unspecified mood [affective] disorder: Secondary | ICD-10-CM

## 2021-05-01 DIAGNOSIS — F902 Attention-deficit hyperactivity disorder, combined type: Secondary | ICD-10-CM

## 2021-05-01 MED ORDER — RISPERIDONE 0.5 MG PO TABS: ORAL_TABLET | ORAL | 3 refills | Status: DC

## 2021-05-01 MED ORDER — BUPROPION HCL ER (XL) 300 MG PO TB24: 300.0000 mg | ORAL_TABLET | Freq: Every day | ORAL | 3 refills | Status: DC

## 2021-05-01 MED ORDER — TRAZODONE HCL 100 MG PO TABS: 200.0000 mg | ORAL_TABLET | Freq: Every day | ORAL | 3 refills | Status: DC

## 2021-05-01 NOTE — Progress Notes (Signed)
Virtual Visit via Telephone Note  I connected with Tracy [name] on 05/01/21 at  1:00 PM EDT by telephone and verified that I am speaking with the correct person using two identifiers.  Location: Patient: home Provider: home office   I discussed the limitations, risks, security and privacy concerns of performing an evaluation and management service by telephone and the availability of in person appointments. I also discussed with the patient that there may be a patient responsible charge related to this service. The patient expressed understanding and agreed to proceed.      I discussed the assessment and treatment plan with the patient. The patient was provided an opportunity to ask questions and all were answered. The patient agreed with the plan and demonstrated an understanding of the instructions.   The patient was advised to call back or seek an in-person evaluation if the symptoms worsen or if the condition fails to improve as anticipated.  I provided 12 minutes of non-face-to-face time during this encounter.   Diannia Ruder, MD  [facility] MD/PA/NP OP Progress Note  05/01/2021 1:13 PM Tracy [name]  MRN:  [id number]  Chief Complaint:  Chief Complaint   Anxiety; Depression; Agitation; Follow-up    HPI: This patient is a 25 year old white female who lives with her mother, her mother's partner and her  sister in [place]. Her biological father resides in [place] and she has not seen him in years. She completed [employer] in an occupational program  The patient returns for follow-up with her mother after 3 months.  This is regarding her history of intellectual disabilities with associated agitated motion and mood swings.  She states that she has been doing well.  She has a very limited repertoire of interests and still likes to play with toys.  She goes to the home of 1 developmentally disabled friend and they play with toys together a play cards.  She still  does not really do much out in the community.  She has not had any violent outbursts or episodes.  She is sleeping and eating well and her energy is good. Visit Diagnosis:    ICD-10-CM   1. Episodic mood disorder (HCC)  F39     2. ADHD (attention deficit hyperactivity disorder), combined type  F90.2       Past Psychiatric History: Hospitalized several times as a child for violent behavior but no hospitalization since 2013  Past Medical History:  Past Medical History:  Diagnosis Date   ADHD (attention deficit hyperactivity disorder)    Bipolar disorder (HCC)    Obesity    Oppositional defiant disorder    Seasonal allergies     Past Surgical History:  Procedure Laterality Date   ADENOIDECTOMY WITH MYRINGOTOMY     corrective surgery for strabismus     left eye    Family Psychiatric History: See below  Family History:  Family History  Problem Relation Age of Onset   Bipolar disorder Sister    ADD / ADHD Sister    Bipolar disorder Maternal Grandmother     Social History:  Social History   Socioeconomic History   Marital status: Single    Spouse name: Not on file   Number of children: Not on file   Years of education: Not on file   Highest education level: Not on file  Occupational History   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 [employer]  Tobacco Use  Smoking status: Passive Smoke Exposure - Never Smoker   Smokeless tobacco: Never  Vaping Use   Vaping Use: Never used  Substance and Sexual Activity   Alcohol use: No   Drug use: No   Sexual activity: Never    Birth control/protection: Pill  Other Topics Concern   Not on file  Social History Narrative   Lives with mom and younger sister, will be attending college this year   Social Determinants of Health   Financial Resource Strain: Not on file  Food Insecurity: Not on file  Transportation Needs: Not on file  Physical Activity: Not on file  Stress: Not on file  Social  Connections: Not on file    Allergies: No Known Allergies  Metabolic Disorder Labs: Lab Results  Component Value Date   HGBA1C 4.8 09/26/2017   MPG 91 09/26/2017   MPG 88 12/04/2016   Lab Results  Component Value Date   PROLACTIN 34.8 09/11/2014   Lab Results  Component Value Date   CHOL 165 09/29/2018   TRIG 85 09/29/2018   HDL 60 09/29/2018   CHOLHDL 2.8 09/29/2018   VLDL 12 03/21/2017   LDLCALC 87 09/29/2018   LDLCALC 89 03/21/2018   Lab Results  Component Value Date   TSH 0.80 03/21/2018   TSH 0.58 12/04/2016    Therapeutic Level Labs: No results found for: LITHIUM No results found for: VALPROATE No components found for:  CBMZ  Current Medications: Current Outpatient Medications  Medication Sig Dispense Refill   Azelastine HCl (ASTEPRO) 0.15 % SOLN USE 2 SPRAYS INTO EACH NOSTRIL DAILY 30 mL 3   buPROPion (WELLBUTRIN XL) 300 MG 24 hr tablet Take 1 tablet (300 mg total) by mouth daily. 90 tablet 3   cetirizine (ZYRTEC) 10 MG tablet TAKE 1 TABLET BY MOUTH EVERY DAY 30 tablet 5   montelukast (SINGULAIR) 10 MG tablet TAKE 1 TABLET BY MOUTH EVERY DAY 30 tablet 5   polyethylene glycol (MIRALAX / GLYCOLAX) packet Take 17 g by mouth daily. Reported on 08/16/2015     risperiDONE (RISPERDAL) 0.5 MG tablet TAKE 1 TABLET(0.5 MG) BY MOUTH TWICE DAILY 180 tablet 3   rosuvastatin (CRESTOR) 5 MG tablet Take 1 tablet (5 mg total) by mouth daily. 90 tablet 3   traZODone (DESYREL) 100 MG tablet Take 2 tablets (200 mg total) by mouth at bedtime. 180 tablet 3   TRI-LO-SPRINTEC 0.18/0.215/0.25 MG-25 MCG tab TAKE 1 TABLET BY MOUTH EVERY DAY 28 tablet 0   No current facility-administered medications for this visit.     Musculoskeletal: Strength & Muscle Tone: na Gait & Station: na Patient leans: N/A  Psychiatric Specialty Exam: Review of Systems  All other systems reviewed and are negative.  There were no vitals taken for this visit.There is no height or weight on file to  calculate BMI.  General Appearance: NA  Eye Contact:  NA  Speech:  Clear and Coherent  Volume:  Normal  Mood:  Euthymic  Affect:  NA  Thought Process:  Goal Directed  Orientation:  Full (Time, Place, and Person)  Thought Content: WDL   Suicidal Thoughts:  No  Homicidal Thoughts:  No  Memory:  Immediate;   Good Recent;   Fair Remote;   NA  Judgement:  Poor  Insight:  Lacking  Psychomotor Activity:  Normal  Concentration:  Concentration: Fair and Attention Span: Fair  Recall:  [employer] of Knowledge: Fair  Language: Good  Akathisia:  No  Handed:  Right  AIMS (if indicated): not done  Assets:  Communication Skills Desire for Improvement Physical Health Resilience Social Support  ADL's:  Intact  Cognition: Impaired,  Mild  Sleep:  Good   Screenings: PHQ2-9    Flowsheet Row Video [facility]01[facility]2021 [facility] Office Visit from 10/01/2017 in [facility] Office Visit from 03/28/2017 in [facility] Office Visit from 12/04/2016 in [facility] Office Visit from 11/05/2014 in [facility]  PHQ-2 Total Score 0 0 0 0 1  PHQ-9 Total Score -- -- -- -- 8      Flowsheet Row Video Visit from 05/01/2021 in [facility]  C-SSRS RISK CATEGORY No Risk        Assessment and Plan: Patient is a 25 year old female with a history of cognitive impairment ADHD agitation and anxiety.  Since she is in a very controlled status and predictable environment she has not had any further agitation and is doing well on her current regimen.  Her mother states that she has a disability hearing coming up and I certainly do not think the patient would be able to work as she would probably decompensate if stressed.  She will continue trazodone 200 mg at bedtime for sleep, Wellbutrin XL 300 mg daily for depression and Risperdal 0.5 mg twice daily  for agitation.  She will return to see me in 3 months   Diannia Ruder, MD 05/01/2021, 1:13 PM

## 2021-05-02 ENCOUNTER — Encounter (HOSPITAL_COMMUNITY): Payer: Self-pay | Admitting: Psychiatry

## 2021-05-08 ENCOUNTER — Telehealth (HOSPITAL_COMMUNITY): Payer: Medicaid Other | Admitting: Psychiatry

## 2021-07-13 ENCOUNTER — Other Ambulatory Visit (HOSPITAL_COMMUNITY): Payer: Self-pay | Admitting: Psychiatry

## 2021-09-12 ENCOUNTER — Telehealth (INDEPENDENT_AMBULATORY_CARE_PROVIDER_SITE_OTHER): Payer: Medicare Other | Admitting: Psychiatry

## 2021-09-12 ENCOUNTER — Other Ambulatory Visit: Payer: Self-pay

## 2021-09-12 ENCOUNTER — Encounter (HOSPITAL_COMMUNITY): Payer: Self-pay | Admitting: Psychiatry

## 2021-09-12 DIAGNOSIS — F39 Unspecified mood [affective] disorder: Secondary | ICD-10-CM | POA: Diagnosis not present

## 2021-09-12 DIAGNOSIS — F902 Attention-deficit hyperactivity disorder, combined type: Secondary | ICD-10-CM | POA: Diagnosis not present

## 2021-09-12 MED ORDER — TRAZODONE HCL 100 MG PO TABS: ORAL_TABLET | ORAL | 3 refills | Status: DC

## 2021-09-12 MED ORDER — RISPERIDONE 0.5 MG PO TABS: ORAL_TABLET | ORAL | 3 refills | Status: DC

## 2021-09-12 MED ORDER — BUPROPION HCL ER (XL) 300 MG PO TB24: 300.0000 mg | ORAL_TABLET | Freq: Every day | ORAL | 3 refills | Status: DC

## 2021-09-12 NOTE — Progress Notes (Signed)
Virtual Visit via Telephone Note  I connected with Tracy [name] on 09/12/21 at  3:00 PM EST by telephone and verified that I am speaking with the correct person using two identifiers.  Location: Patient: home Provider: office   I discussed the limitations, risks, security and privacy concerns of performing an evaluation and management service by telephone and the availability of in person appointments. I also discussed with the patient that there may be a patient responsible charge related to this service. The patient expressed understanding and agreed to proceed.       I discussed the assessment and treatment plan with the patient. The patient was provided an opportunity to ask questions and all were answered. The patient agreed with the plan and demonstrated an understanding of the instructions.   The patient was advised to call back or seek an in-person evaluation if the symptoms worsen or if the condition fails to improve as anticipated.  I provided 15 minutes of non-face-to-face time during this encounter.   Diannia Ruder, MD  [facility] MD/PA/NP OP Progress Note  09/12/2021 3:10 PM Tracy [name]  MRN:  [id number]  Chief Complaint:  Depression, agitation HPI: This patient is a 26 year old white female who lives with her mother, her mother's partner and her  sister in [place]. Her biological father resides in [place] and she has not seen him in years. She completed [employer] school in an occupational program  The patient returns for follow-up after 4 months.  She states that nothing much is changed.  She has a history of intellectual debilities and associated agitation and mood swings.  She states that she still gets upset when she gets in trouble but other than that her mood has been stable.  She denies any twitching or jerking although sometimes her eyelids twitch when she has been on the computer too long.  She does not really do much in the community but still  likes to play with toys like a younger child.  She is sleeping and eating well and her energy is good.  Her mother's partner states she has had no significant outbursts. Visit Diagnosis:    ICD-10-CM   1. Episodic mood disorder (HCC)  F39     2. ADHD (attention deficit hyperactivity disorder), combined type  F90.2       Past Psychiatric History: Hospitalized several times as a child for violent behavior but no hospitalizations since 2013  Past Medical History:  Past Medical History:  Diagnosis Date   ADHD (attention deficit hyperactivity disorder)    Bipolar disorder (HCC)    Obesity    Oppositional defiant disorder    Seasonal allergies     Past Surgical History:  Procedure Laterality Date   ADENOIDECTOMY WITH MYRINGOTOMY     corrective surgery for strabismus     left eye    Family Psychiatric History: see below  Family History:  Family History  Problem Relation Age of Onset   Bipolar disorder Sister    ADD / ADHD Sister    Bipolar disorder Maternal Grandmother     Social History:  Social History   Socioeconomic History   Marital status: Single    Spouse name: Not on file   Number of children: Not on file   Years of education: Not on file   Highest education level: Not on file  Occupational History   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 [employer]  Tobacco Use  Smoking status: Passive Smoke Exposure - Never Smoker   Smokeless tobacco: Never  Vaping Use   Vaping Use: Never used  Substance and Sexual Activity   Alcohol use: No   Drug use: No   Sexual activity: Never    Birth control/protection: Pill  Other Topics Concern   Not on file  Social History Narrative   Lives with mom and younger sister, will be attending college this year   Social Determinants of Health   Financial Resource Strain: Not on file  Food Insecurity: Not on file  Transportation Needs: Not on file  Physical Activity: Not on file  Stress: Not  on file  Social Connections: Not on file    Allergies: No Known Allergies  Metabolic Disorder Labs: Lab Results  Component Value Date   HGBA1C 4.8 09/26/2017   MPG 91 09/26/2017   MPG 88 12/04/2016   Lab Results  Component Value Date   PROLACTIN 34.8 09/11/2014   Lab Results  Component Value Date   CHOL 165 09/29/2018   TRIG 85 09/29/2018   HDL 60 09/29/2018   CHOLHDL 2.8 09/29/2018   VLDL 12 03/21/2017   LDLCALC 87 09/29/2018   LDLCALC 89 03/21/2018   Lab Results  Component Value Date   TSH 0.80 03/21/2018   TSH 0.58 12/04/2016    Therapeutic Level Labs: No results found for: LITHIUM No results found for: VALPROATE No components found for:  CBMZ  Current Medications: Current Outpatient Medications  Medication Sig Dispense Refill   Azelastine HCl (ASTEPRO) 0.15 % SOLN USE 2 SPRAYS INTO EACH NOSTRIL DAILY 30 mL 3   buPROPion (WELLBUTRIN XL) 300 MG 24 hr tablet Take 1 tablet (300 mg total) by mouth daily. 90 tablet 3   cetirizine (ZYRTEC) 10 MG tablet TAKE 1 TABLET BY MOUTH EVERY DAY 30 tablet 5   montelukast (SINGULAIR) 10 MG tablet TAKE 1 TABLET BY MOUTH EVERY DAY 30 tablet 5   polyethylene glycol (MIRALAX / GLYCOLAX) packet Take 17 g by mouth daily. Reported on 08/16/2015     risperiDONE (RISPERDAL) 0.5 MG tablet TAKE 1 TABLET(0.5 MG) BY MOUTH TWICE DAILY 180 tablet 3   rosuvastatin (CRESTOR) 5 MG tablet Take 1 tablet (5 mg total) by mouth daily. 90 tablet 3   traZODone (DESYREL) 100 MG tablet TAKE 2 TABLETS(200 MG) BY MOUTH AT BEDTIME 180 tablet 3   TRI-LO-SPRINTEC 0.18/0.215/0.25 MG-25 MCG tab TAKE 1 TABLET BY MOUTH EVERY DAY 28 tablet 0   No current facility-administered medications for this visit.     Musculoskeletal: Strength & Muscle Tone: na Gait & Station: na Patient leans: N/A  Psychiatric Specialty Exam: Review of Systems  All other systems reviewed and are negative.  There were no vitals taken for this visit.There is no height or weight on  file to calculate BMI.  General Appearance: NA  Eye Contact:  NA  Speech:  Clear and Coherent  Volume:  Normal  Mood:  Euphoric  Affect:  NA  Thought Process:  Goal Directed  Orientation:  Full (Time, Place, and Person)  Thought Content: WDL   Suicidal Thoughts:  No  Homicidal Thoughts:  No  Memory:  Immediate;   Fair  Judgement:  Poor  Insight:  Lacking  Psychomotor Activity:  Normal  Concentration:  Concentration: Fair and Attention Span: Fair  Recall:  [employer] of Knowledge: Fair  Language: Good  Akathisia:  No  Handed:  Right  AIMS (if indicated): not done  Assets:  Communication Skills  Desire for Improvement Physical Health Resilience Social Support  ADL's:  Intact  Cognition: Impaired,  Mild  Sleep:  Good   Screenings: PHQ2-9    Flowsheet Row Video Visit from 09/12/2021 in [facility] Video Visit from 05/01/2021 in [facility] Office Visit from 10[facility]01/2017 in [facility] Office Visit from 03/28/2017 in [facility] Office Visit from 12/04/2016 in [facility]  PHQ-2 Total Score 0 0 0 0 0      Flowsheet Row Video Visit from 09/12/2021 in [facility] Video Visit from 05/01/2021 in [facility]  C-SSRS RISK CATEGORY No Risk No Risk        Assessment and Plan: This patient is a 26 year old female with a history of cognitive impairment ADHD agitation and anxiety.  She continues to do well on her current regimen.  She will continue trazodone 200 mg at bedtime for sleep, Wellbutrin XL 300 mg daily for depression and Risperdal 0.5 mg twice daily for agitation.  She will return to see me in 3 months  Collaboration of Care: Collaboration of Care: Primary Care Provider AEB   notes will be available to primary care physician on guardian's  consent  Patient/Guardian was advised Release of Information must be obtained prior to any record release in order to collaborate their care with an outside provider. Patient/Guardian was advised if they have not already done so to contact the registration department to sign all necessary forms in order for [place] to release information regarding their care.   Consent: Patient/Guardian gives verbal consent for treatment and assignment of benefits for services provided during this telehealth visit. Patient/Guardian expressed understanding and agreed to proceed.    Diannia Ruder, MD 09/12/2021, 3:10 PM

## 2022-01-10 ENCOUNTER — Encounter (HOSPITAL_COMMUNITY): Payer: Self-pay | Admitting: Psychiatry

## 2022-01-10 ENCOUNTER — Telehealth (INDEPENDENT_AMBULATORY_CARE_PROVIDER_SITE_OTHER): Payer: Medicare Other | Admitting: Psychiatry

## 2022-01-10 DIAGNOSIS — F39 Unspecified mood [affective] disorder: Secondary | ICD-10-CM

## 2022-01-10 MED ORDER — RISPERIDONE 0.5 MG PO TABS: ORAL_TABLET | ORAL | 3 refills | Status: DC

## 2022-01-10 MED ORDER — TRAZODONE HCL 100 MG PO TABS: ORAL_TABLET | ORAL | 3 refills | Status: DC

## 2022-01-10 MED ORDER — BUPROPION HCL ER (XL) 300 MG PO TB24: 300.0000 mg | ORAL_TABLET | Freq: Every day | ORAL | 3 refills | Status: DC

## 2022-01-10 NOTE — Progress Notes (Signed)
Virtual Visit via Telephone Note  I connected with Tracy [name] on 01/10/22 at  3:00 PM EDT by telephone and verified that I am speaking with the correct person using two identifiers.  Location: Patient: home Provider: office   I discussed the limitations, risks, security and privacy concerns of performing an evaluation and management service by telephone and the availability of in person appointments. I also discussed with the patient that there may be a patient responsible charge related to this service. The patient expressed understanding and agreed to proceed.      I discussed the assessment and treatment plan with the patient. The patient was provided an opportunity to ask questions and all were answered. The patient agreed with the plan and demonstrated an understanding of the instructions.   The patient was advised to call back or seek an in-person evaluation if the symptoms worsen or if the condition fails to improve as anticipated.  I provided 12 minutes of non-face-to-face time during this encounter.   Diannia Ruder, MD  [facility] MD/PA/NP OP Progress Note  01/10/2022 3:08 PM Tracy [name]  MRN:  [id number]  Chief Complaint:  Chief Complaint  Patient presents with   Anxiety   Agitation   Follow-up   HPI: This patient is a 26 year old white female who lives with her mother, her mother's partner and her  sister in [place]. Her biological father resides in [place] and she has not seen him in years. She completed [employer] in an occupational program  The patient returns for follow-up after 4 months.  She states she is doing about the same.  She still spends time playing with her toys helping around the house going on walks.  She only gets upset when she gets in trouble and then she cries and has mild tantrums.  Her mother's partner states that this is very rare at this point.  She is sleeping and eating well and her energy is good.  She denies being  depressed sad or anxious.  She denies thoughts of self-harm or suicide. Visit Diagnosis:    ICD-10-CM   1. Episodic mood disorder (HCC)  F39       Past Psychiatric History: Hospitalized several times as a child for violent behavior but no hospitalizations since 2013  Past Medical History:  Past Medical History:  Diagnosis Date   ADHD (attention deficit hyperactivity disorder)    Bipolar disorder (HCC)    Obesity    Oppositional defiant disorder    Seasonal allergies     Past Surgical History:  Procedure Laterality Date   ADENOIDECTOMY WITH MYRINGOTOMY     corrective surgery for strabismus     left eye    Family Psychiatric History: See below  Family History:  Family History  Problem Relation Age of Onset   Bipolar disorder Sister    ADD / ADHD Sister    Bipolar disorder Maternal Grandmother     Social History:  Social History   Socioeconomic History   Marital status: Single    Spouse name: Not on file   Number of children: Not on file   Years of education: Not on file   Highest education level: Not on file  Occupational History   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 [employer]  Tobacco Use   Smoking status: Passive Smoke Exposure - Never Smoker   Smokeless tobacco: Never  Vaping Use   Vaping Use: Never used  Substance and Sexual Activity   Alcohol use: No   Drug use: No   Sexual activity: Never    Birth control/protection: Pill  Other Topics Concern   Not on file  Social History Narrative   Lives with mom and younger sister, will be attending college this year   Social Determinants of Health   Financial Resource Strain: Not on file  Food Insecurity: Not on file  Transportation Needs: Not on file  Physical Activity: Not on file  Stress: Not on file  Social Connections: Not on file    Allergies: No Known Allergies  Metabolic Disorder Labs: Lab Results  Component Value Date   HGBA1C 4.8 09/26/2017   MPG 91  09/26/2017   MPG 88 12/04/2016   Lab Results  Component Value Date   PROLACTIN 34.8 09/11/2014   Lab Results  Component Value Date   CHOL 165 09/29/2018   TRIG 85 09/29/2018   HDL 60 09/29/2018   CHOLHDL 2.8 09/29/2018   VLDL 12 03/21/2017   LDLCALC 87 09/29/2018   LDLCALC 89 03/21/2018   Lab Results  Component Value Date   TSH 0.80 03/21/2018   TSH 0.58 12/04/2016    Therapeutic Level Labs: No results found for: "LITHIUM" No results found for: "VALPROATE" No results found for: "CBMZ"  Current Medications: Current Outpatient Medications  Medication Sig Dispense Refill   Azelastine HCl (ASTEPRO) 0.15 % SOLN USE 2 SPRAYS INTO EACH NOSTRIL DAILY 30 mL 3   buPROPion (WELLBUTRIN XL) 300 MG 24 hr tablet Take 1 tablet (300 mg total) by mouth daily. 90 tablet 3   cetirizine (ZYRTEC) 10 MG tablet TAKE 1 TABLET BY MOUTH EVERY DAY 30 tablet 5   montelukast (SINGULAIR) 10 MG tablet TAKE 1 TABLET BY MOUTH EVERY DAY 30 tablet 5   polyethylene glycol (MIRALAX / GLYCOLAX) packet Take 17 g by mouth daily. Reported on 08/16/2015     risperiDONE (RISPERDAL) 0.5 MG tablet TAKE 1 TABLET(0.5 MG) BY MOUTH TWICE DAILY 180 tablet 3   rosuvastatin (CRESTOR) 5 MG tablet Take 1 tablet (5 mg total) by mouth daily. 90 tablet 3   traZODone (DESYREL) 100 MG tablet TAKE 2 TABLETS(200 MG) BY MOUTH AT BEDTIME 180 tablet 3   TRI-LO-SPRINTEC 0.18/0.215/0.25 MG-25 MCG tab TAKE 1 TABLET BY MOUTH EVERY DAY 28 tablet 0   No current facility-administered medications for this visit.     Musculoskeletal: Strength & Muscle Tone: na Gait & Station: na Patient leans: N/A  Psychiatric Specialty Exam: Review of Systems  All other systems reviewed and are negative.   There were no vitals taken for this visit.There is no height or weight on file to calculate BMI.  General Appearance: NA  Eye Contact:  NA  Speech:  Clear and Coherent  Volume:  Normal  Mood:  Euthymic  Affect:  NA  Thought Process:  Goal  Directed  Orientation:  Full (Time, Place, and Person)  Thought Content: WDL   Suicidal Thoughts:  No  Homicidal Thoughts:  No  Memory:  Immediate;   Good Recent;   Fair Remote;   NA  Judgement:  Fair  Insight:  Shallow  Psychomotor Activity:  Normal  Concentration:  Concentration: Fair and Attention Span: Fair  Recall:  [employer]  Fund of Knowledge: Fair  Language: Good  Akathisia:  No  Handed:  Right  AIMS (if indicated): not done  Assets:  Communication Skills Physical Health Resilience Social Support  ADL's:  Intact  Cognition: Impaired,  Mild  Sleep:  Good   Screenings: PHQ2-9    Flowsheet Row Video [facility]20[facility] Video Visit from 09/12/2021 in [facility] Video Visit from 05/01/2021 in [facility] Office Visit from 10/01/2017 in [facility] Office Visit from 03/28/2017 in [facility]  PHQ-2 Total Score 0 0 0 0 0      Flowsheet Row Video Visit from 01/10/2022 in [facility] Video Visit from 09/12/2021 in [facility] Video Visit from 05/01/2021 in [facility]  C-SSRS RISK CATEGORY No Risk No Risk No Risk        Assessment and Plan: This patient is a 26 year old female with a history of cognitive impairment ADHD and agitation and anxiety.  She continues to do well on her current regimen without side effect.  She will continue trazodone 200 mg at bedtime for sleep, Wellbutrin XL 300 mg daily for depression and Risperdal 0.5 mg twice daily for agitation.  She will return to see me in 4 months.  Collaboration of Care: Collaboration of Care: Primary Care Provider AEB notes will be made available to PCP at guardian's request  Patient/Guardian was advised Release of  Information must be obtained prior to any record release in order to collaborate their care with an outside provider. Patient/Guardian was advised if they have not already done so to contact the registration department to sign all necessary forms in order for [place] to release information regarding their care.   Consent: Patient/Guardian gives verbal consent for treatment and assignment of benefits for services provided during this visit. Patient/Guardian expressed understanding and agreed to proceed.    Diannia Ruder, MD 01/10/2022, 3:08 PM

## 2022-03-28 ENCOUNTER — Encounter (HOSPITAL_COMMUNITY): Payer: Self-pay | Admitting: Psychiatry

## 2022-03-28 ENCOUNTER — Ambulatory Visit (INDEPENDENT_AMBULATORY_CARE_PROVIDER_SITE_OTHER): Payer: Medicare Other | Admitting: Psychiatry

## 2022-03-28 VITALS — BP 98/65 | HR 86 | Ht 68.0 in | Wt 207.0 lb

## 2022-03-28 DIAGNOSIS — F39 Unspecified mood [affective] disorder: Secondary | ICD-10-CM | POA: Diagnosis not present

## 2022-03-28 DIAGNOSIS — G2401 Drug induced subacute dyskinesia: Secondary | ICD-10-CM | POA: Diagnosis not present

## 2022-03-28 MED ORDER — TRAZODONE HCL 100 MG PO TABS
ORAL_TABLET | ORAL | 3 refills | Status: DC
Start: 2022-03-28 — End: 2022-05-17

## 2022-03-28 MED ORDER — BUPROPION HCL ER (XL) 300 MG PO TB24: 300.0000 mg | ORAL_TABLET | Freq: Every day | ORAL | 3 refills | Status: DC
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
## 2022-05-17 ENCOUNTER — Encounter (HOSPITAL_COMMUNITY): Payer: Self-pay | Admitting: Psychiatry

## 2022-05-17 ENCOUNTER — Telehealth (INDEPENDENT_AMBULATORY_CARE_PROVIDER_SITE_OTHER): Payer: Medicare Other | Admitting: Psychiatry

## 2022-05-17 DIAGNOSIS — F39 Unspecified mood [affective] disorder: Secondary | ICD-10-CM

## 2022-05-17 MED ORDER — TRAZODONE HCL 100 MG PO TABS: ORAL_TABLET | ORAL | 3 refills | Status: DC

## 2022-05-17 MED ORDER — LAMOTRIGINE 25 MG PO TABS: 25.0000 mg | ORAL_TABLET | Freq: Two times a day (BID) | ORAL | 2 refills | Status: DC

## 2022-05-17 MED ORDER — BUPROPION HCL ER (XL) 300 MG PO TB24: 300.0000 mg | ORAL_TABLET | Freq: Every day | ORAL | 3 refills | Status: DC
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
## 2022-05-22 ENCOUNTER — Other Ambulatory Visit (HOSPITAL_COMMUNITY): Payer: Self-pay | Admitting: Psychiatry

## 2022-06-26 ENCOUNTER — Other Ambulatory Visit (HOSPITAL_COMMUNITY): Payer: Self-pay | Admitting: Nurse Practitioner

## 2022-06-26 DIAGNOSIS — M5451 Vertebrogenic low back pain: Secondary | ICD-10-CM

## 2022-06-28 ENCOUNTER — Encounter (HOSPITAL_COMMUNITY): Payer: Self-pay | Admitting: Psychiatry

## 2022-06-28 ENCOUNTER — Telehealth (INDEPENDENT_AMBULATORY_CARE_PROVIDER_SITE_OTHER): Payer: Medicare Other | Admitting: Psychiatry

## 2022-06-28 DIAGNOSIS — F39 Unspecified mood [affective] disorder: Secondary | ICD-10-CM | POA: Diagnosis not present

## 2022-06-28 MED ORDER — LAMOTRIGINE 25 MG PO TABS: 25.0000 mg | ORAL_TABLET | Freq: Two times a day (BID) | ORAL | 2 refills | Status: DC

## 2022-06-28 MED ORDER — BUPROPION HCL ER (XL) 300 MG PO TB24: 300.0000 mg | ORAL_TABLET | Freq: Every day | ORAL | 3 refills | Status: DC

## 2022-06-28 MED ORDER — TRAZODONE HCL 100 MG PO TABS
ORAL_TABLET | ORAL | 3 refills | Status: DC
Start: 2022-06-28 — End: 2022-10-08
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
## 2022-07-18 ENCOUNTER — Ambulatory Visit (HOSPITAL_COMMUNITY)
Admission: RE | Admit: 2022-07-18 | Discharge: 2022-07-18 | Disposition: A | Discharge status: Home / Self Care | Payer: Medicare Other | Source: Ambulatory Visit | Attending: Nurse Practitioner | Admitting: Nurse Practitioner

## 2022-07-18 DIAGNOSIS — M5451 Vertebrogenic low back pain: Secondary | ICD-10-CM | POA: Insufficient documentation

## 2022-09-20 ENCOUNTER — Other Ambulatory Visit (HOSPITAL_COMMUNITY): Payer: Self-pay | Admitting: Psychiatry

## 2022-09-27 ENCOUNTER — Telehealth (HOSPITAL_COMMUNITY): Payer: Medicare Other | Admitting: Psychiatry

## 2022-10-03 ENCOUNTER — Ambulatory Visit (HOSPITAL_COMMUNITY): Payer: Medicare Other | Attending: Neurological Surgery | Admitting: Physical Therapy

## 2022-10-03 DIAGNOSIS — M5459 Other low back pain: Secondary | ICD-10-CM | POA: Insufficient documentation

## 2022-10-03 NOTE — Patient Instructions (Signed)

## 2022-10-03 NOTE — Therapy (Addendum)
OUTPATIENT PHYSICAL THERAPY THORACOLUMBAR EVALUATION   Patient Name: Tracy [name] MRN: [id number] DOB:12-01-1995, 27 y.o., female Today's Date: 10/03/2022  END OF SESSION:  PT End of Session - 10/03/22 1043     Visit Number 1    Number of Visits 12    Date for PT Re-Evaluation 11/14/22    Authorization Type Medicare/ [employer]    Progress Note Due on Visit 10    PT Start Time 0955    PT Stop Time 1045    PT Time Calculation (min) 50 min    Activity Tolerance Patient tolerated treatment well    Behavior During Therapy WFL for tasks assessed/performed             Past Medical History:  Diagnosis Date   ADHD (attention deficit hyperactivity disorder)    Bipolar disorder ([place])    Obesity    Oppositional defiant disorder    Seasonal allergies    Past Surgical History:  Procedure Laterality Date   ADENOIDECTOMY WITH MYRINGOTOMY     corrective surgery for strabismus     left eye   Patient Active Problem List   Diagnosis Date Noted   Mixed hyperlipidemia 04/14/2013   Bipolar affective disorder, depressed, severe ([place]) 06/05/2012   ADHD (attention deficit hyperactivity disorder), combined type 06/13/2011   ODD (oppositional defiant disorder) 06/13/2011    PCP: Celene Squibb MD   REFERRING PROVIDER: Eustace Moore, MD  REFERRING DIAG: lumbar spondylosis (dry needling)  Rationale for Evaluation and Treatment: Rehabilitation  THERAPY DIAG:  Other low back pain - Plan: PT plan of care cert/re-cert  ONSET DATE: Chronic (>5 years)  SUBJECTIVE:                                                                                                                                                                                           SUBJECTIVE STATEMENT: Patient presents to therapy with complaint of low back pain. This is ongoing for many years. Patient states pain began insidiously and has gotten progressively worse. She has had therapy in the past but it was a  long time ago. She takes pain meds but it only helps a little.   PERTINENT HISTORY:  NA  PAIN:  Are you having pain? Yes: NPRS scale: 7/10 Pain location: Low back  Pain description: throbbing, aching, sharp  Aggravating factors: standing, bending, lifting Relieving factors: resting, sitting, lying on stomach   PRECAUTIONS: None  WEIGHT BEARING RESTRICTIONS: No  FALLS:  Has patient fallen in last 6 months? No  LIVING ENVIRONMENT: Lives with: lives with their family Lives in: House/apartment Stairs: No Has following equipment  at home: None  OCCUPATION: Disabled   PLOF: Independent  PATIENT GOALS: Help back to feel better   NEXT MD VISIT: Sometime in May   OBJECTIVE:   DIAGNOSTIC FINDINGS:  IMPRESSION: 1. Small right foraminal to extraforaminal disc protrusion at L4-5, closely approximating and potentially irritating the exiting right L4 nerve root. 2. Mild bilateral facet hypertrophy at L2-3 through L4-5. Finding could contribute to lower back pain. 3. Underlying mild levoscoliosis.    PATIENT SURVEYS:  Modified Oswestry 19/50   COGNITION: Overall cognitive status: Within functional limits for tasks assessed     SENSATION: WFL   POSTURE:  slouched seated posture   PALPATION: Min TTP about lumbar paraspinals   LUMBAR ROM:   AROM eval  Flexion 50% limited  Extension 25% limited  Right lateral flexion WNL  Left lateral flexion WNL  Right rotation WNL  Left rotation WNL   (Blank rows = not tested)   LOWER EXTREMITY MMT:    MMT Right eval Left eval  Hip flexion 4+ 4+  Hip extension 4- 4  Hip abduction 4- 4  Hip adduction    Hip internal rotation    Hip external rotation    Knee flexion    Knee extension 4 4  Ankle dorsiflexion 5 5  Ankle plantarflexion    Ankle inversion    Ankle eversion     (Blank rows = not tested)   TODAY'S TREATMENT:                                                                                                                               DATE:   10/03/22 Eval  Ab brace 5 x 5" SRL x 5 each  Trigger Point Dry-Needling  Treatment instructions: Expect mild to moderate muscle soreness. S/S of pneumothorax if dry needled over a lung field, and to seek immediate medical attention should they occur. Patient verbalized understanding of these instructions and education.  Patient Consent Given: Yes Education handout provided: Yes Muscles treated: Bilat lumbar paraspinals Electrical stimulation performed: No Parameters:  NA Treatment response/outcome: Tolerated well      PATIENT EDUCATION:  Education details: on eval findings, POC, dry needling and HEP  Person educated: Patient and Parent Education method: Explanation Education comprehension: verbalized understanding  HOME EXERCISE PROGRAM: Access Code: [email or web address] URL: [email or web address] Date: 10/03/2022 Prepared by: Josue Hector  Exercises - Supine Transversus Abdominis Bracing - Hands on Stomach  - 2-3 x daily - 7 x weekly - 2 sets - 10 reps - 5 second hold - Small Range Straight Leg Raise  - 2-3 x daily - 7 x weekly - 2 sets - 10 reps  ASSESSMENT:  CLINICAL IMPRESSION: Patient is a 27 y.o. female who presents to physical therapy with complaint of LBP. Patient demonstrates muscle weakness, reduced ROM, and fascial restrictions which are likely contributing to symptoms of pain and are negatively impacting patient ability to perform ADLs and functional mobility tasks. Patient will  benefit from skilled physical therapy services to address these deficits to reduce pain and improve level of function with ADLs and functional mobility tasks.   OBJECTIVE IMPAIRMENTS: decreased activity tolerance, decreased mobility, decreased ROM, decreased strength, hypomobility, impaired flexibility, improper body mechanics, postural dysfunction, and pain.   ACTIVITY LIMITATIONS: carrying, lifting, bending, sitting, standing, squatting,  sleeping, stairs, transfers, bed mobility, and locomotion level  PARTICIPATION LIMITATIONS: meal prep, cleaning, laundry, shopping, community activity, and yard work  PERSONAL FACTORS: Time since onset of injury/illness/exacerbation are also affecting patient's functional outcome.   REHAB POTENTIAL: Good  CLINICAL DECISION MAKING: Stable/uncomplicated  EVALUATION COMPLEXITY: Low   GOALS: SHORT TERM GOALS: Target date: 10/24/2022  Patient will be independent with initial HEP and self-management strategies to improve functional outcomes Baseline:  Goal status: INITIAL    LONG TERM GOALS: Target date: 11/14/2022  Patient will be independent with advanced HEP and self-management strategies to improve functional outcomes Baseline:  Goal status: INITIAL  2.  Patient will improve Mod ODI score by at least 8 points to indicate improvement in functional outcomes Baseline: 19/50 Goal status: INITIAL  3.  Patient will report reduction of back pain to <3/10 for improved quality of life and ability to perform ADLs  Baseline: 7/10 Goal status: INITIAL  4. Patient will have equal to or > 4+/5 MMT throughout BLE to improve ability to perform functional mobility, stair ambulation and ADLs.  Baseline: See MMT Goal status: INITIAL PLAN:  PT FREQUENCY: 1-2x/week  PT DURATION: 6 weeks  PLANNED INTERVENTIONS: Therapeutic exercises, Therapeutic activity, Neuromuscular re-education, Balance training, Gait training, Patient/Family education, Joint manipulation, Joint mobilization, Stair training, Aquatic Therapy, Dry Needling, Electrical stimulation, Spinal manipulation, Spinal mobilization, Cryotherapy, Moist heat, scar mobilization, Taping, Traction, Ultrasound, Biofeedback, Ionotophoresis '4mg'$ /ml Dexamethasone, and Manual therapy. Marland Kitchen  PLAN FOR NEXT SESSION: f/u on dry needling. Continue as indicated. Progress glute and core strengthening as tolerated.   4:04 PM, 10/03/22 Josue Hector PT  DPT  Physical Therapist with [facility]  [phone]

## 2022-10-08 ENCOUNTER — Telehealth (INDEPENDENT_AMBULATORY_CARE_PROVIDER_SITE_OTHER): Payer: Medicare Other | Admitting: Psychiatry

## 2022-10-08 ENCOUNTER — Encounter (HOSPITAL_COMMUNITY): Payer: Self-pay | Admitting: Psychiatry

## 2022-10-08 DIAGNOSIS — F39 Unspecified mood [affective] disorder: Secondary | ICD-10-CM

## 2022-10-08 MED ORDER — BUPROPION HCL ER (XL) 300 MG PO TB24: 300.0000 mg | ORAL_TABLET | Freq: Every day | ORAL | 3 refills | Status: DC

## 2022-10-08 MED ORDER — LAMOTRIGINE 25 MG PO TABS: ORAL_TABLET | ORAL | 3 refills | Status: DC

## 2022-10-08 MED ORDER — TRAZODONE HCL 100 MG PO TABS: ORAL_TABLET | ORAL | 3 refills | Status: DC
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
## 2022-10-17 ENCOUNTER — Ambulatory Visit (HOSPITAL_COMMUNITY): Payer: Medicare Other | Admitting: Physical Therapy

## 2022-10-17 DIAGNOSIS — M5459 Other low back pain: Secondary | ICD-10-CM | POA: Diagnosis not present

## 2022-10-17 NOTE — Therapy (Signed)
OUTPATIENT PHYSICAL THERAPY THORACOLUMBAR TREATMENT   Patient Name: Tracy [name] MRN: [id number] DOB:[birth year], 27 y.o., female Today's Date: 10/17/2022  END OF SESSION:  PT End of Session - 10/17/22 0955     Visit Number 2    Number of Visits 12    Date for PT Re-Evaluation 11/14/22    Authorization Type Medicare/ [employer] 2ndary    Progress Note Due on Visit 10    PT Start Time 0950    PT Stop Time 1028    PT Time Calculation (min) 38 min    Activity Tolerance Patient tolerated treatment well    Behavior During Therapy WFL for tasks assessed/performed             Past Medical History:  Diagnosis Date   ADHD (attention deficit hyperactivity disorder)    Bipolar disorder ([place])    Obesity    Oppositional defiant disorder    Seasonal allergies    Past Surgical History:  Procedure Laterality Date   ADENOIDECTOMY WITH MYRINGOTOMY     corrective surgery for strabismus     left eye   Patient Active Problem List   Diagnosis Date Noted   Mixed hyperlipidemia 04/14/2013   Bipolar affective disorder, depressed, severe ([place]) 06/05/2012   ADHD (attention deficit hyperactivity disorder), combined type 06/13/2011   ODD (oppositional defiant disorder) 06/13/2011    PCP: Celene Squibb MD   REFERRING PROVIDER: Eustace Moore, MD  REFERRING DIAG: lumbar spondylosis (dry needling)  Rationale for Evaluation and Treatment: Rehabilitation  THERAPY DIAG:  Other low back pain  ONSET DATE: Chronic (>5 years)  SUBJECTIVE:                                                                                                                                                                                           SUBJECTIVE STATEMENT: Patient states dry needling helped with pain. She feels about the same today. Pain at a 7/10  Patient presents to therapy with complaint of low back pain. This is ongoing for many years. Patient states pain began insidiously and has gotten  progressively worse. She has had therapy in the past but it was a long time ago. She takes pain meds but it only helps a little.   PERTINENT HISTORY:  NA  PAIN:  Are you having pain? Yes: NPRS scale: 7/10 Pain location: Low back  Pain description: throbbing, aching, sharp  Aggravating factors: standing, bending, lifting Relieving factors: resting, sitting, lying on stomach   PRECAUTIONS: None  WEIGHT BEARING RESTRICTIONS: No  FALLS:  Has patient fallen in last 6 months? No  LIVING ENVIRONMENT: Lives with: lives  with their family Lives in: House/apartment Stairs: No Has following equipment at home: None  OCCUPATION: Disabled   PLOF: Independent  PATIENT GOALS: Help back to feel better   NEXT MD VISIT: Sometime in May   OBJECTIVE:   DIAGNOSTIC FINDINGS:  IMPRESSION: 1. Small right foraminal to extraforaminal disc protrusion at L4-5, closely approximating and potentially irritating the exiting right L4 nerve root. 2. Mild bilateral facet hypertrophy at L2-3 through L4-5. Finding could contribute to lower back pain. 3. Underlying mild levoscoliosis.    PATIENT SURVEYS:  Modified Oswestry 19/50   COGNITION: Overall cognitive status: Within functional limits for tasks assessed     SENSATION: WFL   POSTURE:  slouched seated posture   PALPATION: Min TTP about lumbar paraspinals   LUMBAR ROM:   AROM eval  Flexion 50% limited  Extension 25% limited  Right lateral flexion WNL  Left lateral flexion WNL  Right rotation WNL  Left rotation WNL   (Blank rows = not tested)   LOWER EXTREMITY MMT:    MMT Right eval Left eval  Hip flexion 4+ 4+  Hip extension 4- 4  Hip abduction 4- 4  Hip adduction    Hip internal rotation    Hip external rotation    Knee flexion    Knee extension 4 4  Ankle dorsiflexion 5 5  Ankle plantarflexion    Ankle inversion    Ankle eversion     (Blank rows = not tested)   TODAY'S TREATMENT:                                                                                                                               DATE:  10/17/22 Ab brace 10 x 5" Ab march x20 SLR with ab brace 2 x 10 each Bridge 20 x 5"   Piriformis stretch 3 x 30" each   Sidelying hip abduction 2 x 10 each   Manual STM to bilat lumbar paraspinal pre and post dry needling for trigger point identification and surface area preparation   Trigger Point Dry-Needling  Treatment instructions: Expect mild to moderate muscle soreness. S/S of pneumothorax if dry needled over a lung field, and to seek immediate medical attention should they occur. Patient verbalized understanding of these instructions and education.  Patient Consent Given: Yes Education handout provided: Yes Muscles treated: Bilat lumbar paraspinals Electrical stimulation performed: No Parameters: NA Treatment response/outcome: Tolerated well   10/03/22 Eval  Ab brace 5 x 5" SRL x 5 each  Trigger Point Dry-Needling  Treatment instructions: Expect mild to moderate muscle soreness. S/S of pneumothorax if dry needled over a lung field, and to seek immediate medical attention should they occur. Patient verbalized understanding of these instructions and education.  Patient Consent Given: Yes Education handout provided: Yes Muscles treated: Bilat lumbar paraspinals Electrical stimulation performed: No Parameters:  NA Treatment response/outcome: Tolerated well      PATIENT EDUCATION:  Education details: on eval findings,  POC, dry needling and HEP  Person educated: Patient and Parent Education method: Explanation Education comprehension: verbalized understanding  HOME EXERCISE PROGRAM: Access Code: [email or web address] URL: [email or web address] 10/17/22 - Supine March  - 2-3 x daily - 7 x weekly - 2 sets - 10 reps - Supine Bridge  - 2-3 x daily - 7 x weekly - 2 sets - 10 reps - 5 second hold - Sidelying Hip Abduction  - 2-3 x daily - 7 x weekly - 2 sets - 10 reps -  Supine Piriformis Stretch with Foot on Ground  - 2-3 x daily - 7 x weekly - 1 sets - 3 reps - 30 second hold   Date: 10/03/2022 Prepared by: Josue Hector  Exercises - Supine Transversus Abdominis Bracing - Hands on Stomach  - 2-3 x daily - 7 x weekly - 2 sets - 10 reps - 5 second hold - Small Range Straight Leg Raise  - 2-3 x daily - 7 x weekly - 2 sets - 10 reps  ASSESSMENT:  CLINICAL IMPRESSION: Patient tolerated session well today. Progressed hip and core strengthening per flowsheet. Patient educated on purpose and function of all added activity. Patient cued on leg position during sidelying leg raise for improved glute medius activation. Updated HEP and issued handout. Patient will continue to benefit from skilled therapy services to reduce remaining deficits and improve functional ability.    OBJECTIVE IMPAIRMENTS: decreased activity tolerance, decreased mobility, decreased ROM, decreased strength, hypomobility, impaired flexibility, improper body mechanics, postural dysfunction, and pain.   ACTIVITY LIMITATIONS: carrying, lifting, bending, sitting, standing, squatting, sleeping, stairs, transfers, bed mobility, and locomotion level  PARTICIPATION LIMITATIONS: meal prep, cleaning, laundry, shopping, community activity, and yard work  PERSONAL FACTORS: Time since onset of injury/illness/exacerbation are also affecting patient's functional outcome.   REHAB POTENTIAL: Good  CLINICAL DECISION MAKING: Stable/uncomplicated  EVALUATION COMPLEXITY: Low   GOALS: SHORT TERM GOALS: Target date: 10/24/2022  Patient will be independent with initial HEP and self-management strategies to improve functional outcomes Baseline:  Goal status: INITIAL    LONG TERM GOALS: Target date: 11/14/2022  Patient will be independent with advanced HEP and self-management strategies to improve functional outcomes Baseline:  Goal status: INITIAL  2.  Patient will improve Mod ODI score by at least 8  points to indicate improvement in functional outcomes Baseline: 19/50 Goal status: INITIAL  3.  Patient will report reduction of back pain to <3/10 for improved quality of life and ability to perform ADLs  Baseline: 7/10 Goal status: INITIAL  4. Patient will have equal to or > 4+/5 MMT throughout BLE to improve ability to perform functional mobility, stair ambulation and ADLs.  Baseline: See MMT Goal status: INITIAL PLAN:  PT FREQUENCY: 1-2x/week  PT DURATION: 6 weeks  PLANNED INTERVENTIONS: Therapeutic exercises, Therapeutic activity, Neuromuscular re-education, Balance training, Gait training, Patient/Family education, Joint manipulation, Joint mobilization, Stair training, Aquatic Therapy, Dry Needling, Electrical stimulation, Spinal manipulation, Spinal mobilization, Cryotherapy, Moist heat, scar mobilization, Taping, Traction, Ultrasound, Biofeedback, Ionotophoresis 4mg /ml Dexamethasone, and Manual therapy. Marland Kitchen  PLAN FOR NEXT SESSION: f/u on dry needling. Continue as indicated. Progress glute and core strengthening as tolerated.   10:31 AM, 10/17/22 Josue Hector PT DPT  Physical Therapist with [facility]  [phone]

## 2022-10-24 ENCOUNTER — Encounter (HOSPITAL_COMMUNITY): Payer: Medicare Other | Admitting: Physical Therapy

## 2022-10-29 ENCOUNTER — Ambulatory Visit (HOSPITAL_COMMUNITY): Payer: Medicare Other | Attending: Orthopedic Surgery | Admitting: Physical Therapy

## 2022-10-29 ENCOUNTER — Encounter (HOSPITAL_COMMUNITY): Payer: Self-pay | Admitting: Physical Therapy

## 2022-10-29 DIAGNOSIS — M5459 Other low back pain: Secondary | ICD-10-CM | POA: Diagnosis present

## 2022-10-29 NOTE — Therapy (Signed)
OUTPATIENT PHYSICAL THERAPY THORACOLUMBAR TREATMENT   Patient Name: Tracy [name] MRN: [id number] DOB:August 19, 1995, 27 y.o., female Today's Date: 10/29/2022  END OF SESSION:  PT End of Session - 10/29/22 0909     Visit Number 3    Number of Visits 12    Date for PT Re-Evaluation 11/14/22    Authorization Type Medicare/ Medicaid 2ndary    Progress Note Due on Visit 10    PT Start Time 0906    PT Stop Time 0944    PT Time Calculation (min) 38 min    Activity Tolerance Patient tolerated treatment well    Behavior During Therapy [facility] for tasks assessed/performed             Past Medical History:  Diagnosis Date   ADHD (attention deficit hyperactivity disorder)    Bipolar disorder    Obesity    Oppositional defiant disorder    Seasonal allergies    Past Surgical History:  Procedure Laterality Date   ADENOIDECTOMY WITH MYRINGOTOMY     corrective surgery for strabismus     left eye   Patient Active Problem List   Diagnosis Date Noted   Mixed hyperlipidemia 04/14/2013   Bipolar affective disorder, depressed, severe 06/05/2012   ADHD (attention deficit hyperactivity disorder), combined type 06/13/2011   ODD (oppositional defiant disorder) 06/13/2011    PCP: Celene Squibb MD   REFERRING PROVIDER: Eustace Moore, MD  REFERRING DIAG: lumbar spondylosis (dry needling)  Rationale for Evaluation and Treatment: Rehabilitation  THERAPY DIAG:  Other low back pain  ONSET DATE: Chronic (>5 years)  SUBJECTIVE:                                                                                                                                                                                           SUBJECTIVE STATEMENT: Exercises help when she does them. Still has some pain in the morning.   Patient presents to therapy with complaint of low back pain. This is ongoing for many years. Patient states pain began insidiously and has gotten progressively worse. She has had therapy  in the past but it was a long time ago. She takes pain meds but it only helps a little.   PERTINENT HISTORY:  NA  PAIN:  Are you having pain? Yes: NPRS scale: 6/10 Pain location: Low back  Pain description: throbbing, aching, sharp  Aggravating factors: standing, bending, lifting Relieving factors: resting, sitting, lying on stomach   PRECAUTIONS: None  WEIGHT BEARING RESTRICTIONS: No  FALLS:  Has patient fallen in last 6 months? No  LIVING ENVIRONMENT: Lives with: lives with their family Lives in:  House/apartment Stairs: No Has following equipment at home: None  OCCUPATION: Disabled   PLOF: Independent  PATIENT GOALS: Help back to feel better   NEXT MD VISIT: Sometime in May   OBJECTIVE:   DIAGNOSTIC FINDINGS:  IMPRESSION: 1. Small right foraminal to extraforaminal disc protrusion at L4-5, closely approximating and potentially irritating the exiting right L4 nerve root. 2. Mild bilateral facet hypertrophy at L2-3 through L4-5. Finding could contribute to lower back pain. 3. Underlying mild levoscoliosis.    PATIENT SURVEYS:  Modified Oswestry 19/50   COGNITION: Overall cognitive status: Within functional limits for tasks assessed     SENSATION: WFL   POSTURE:  slouched seated posture   PALPATION: Min TTP about lumbar paraspinals   LUMBAR ROM:   AROM eval  Flexion 50% limited  Extension 25% limited  Right lateral flexion WNL  Left lateral flexion WNL  Right rotation WNL  Left rotation WNL   (Blank rows = not tested)   LOWER EXTREMITY MMT:    MMT Right eval Left eval  Hip flexion 4+ 4+  Hip extension 4- 4  Hip abduction 4- 4  Hip adduction    Hip internal rotation    Hip external rotation    Knee flexion    Knee extension 4 4  Ankle dorsiflexion 5 5  Ankle plantarflexion    Ankle inversion    Ankle eversion     (Blank rows = not tested)   TODAY'S TREATMENT:                                                                                                                               DATE:  10/29/22 Ab brace 10 x 5" Ab march x20 SLR with ab brace 2 x 10 each Bridge 20 x 5"   Piriformis stretch 3 x 30" each Deadbug x20    Sidelying clamshell with GTB 20 x 3"   Prone hip extension 10 x 3" hold each   Manual STM to bilat lumbar paraspinal pre and post dry needling for trigger point identification and surface area preparation   Trigger Point Dry-Needling  Treatment instructions: Expect mild to moderate muscle soreness. S/S of pneumothorax if dry needled over a lung field, and to seek immediate medical attention should they occur. Patient verbalized understanding of these instructions and education.  Patient Consent Given: Yes Education handout provided: Yes Muscles treated: Bilat lumbar paraspinals Electrical stimulation performed: No Parameters: NA Treatment response/outcome: Tolerated well   10/17/22 Ab brace 10 x 5" Ab march x20 SLR with ab brace 2 x 10 each Bridge 20 x 5"   Piriformis stretch 3 x 30" each   Sidelying hip abduction 2 x 10 each   Manual STM to bilat lumbar paraspinal pre and post dry needling for trigger point identification and surface area preparation   Trigger Point Dry-Needling  Treatment instructions: Expect mild to moderate muscle soreness. S/S of pneumothorax if dry needled over a lung  field, and to seek immediate medical attention should they occur. Patient verbalized understanding of these instructions and education.  Patient Consent Given: Yes Education handout provided: Yes Muscles treated: Bilat lumbar paraspinals Electrical stimulation performed: No Parameters: NA Treatment response/outcome: Tolerated well     PATIENT EDUCATION:  Education details: on eval findings, POC, dry needling and HEP  Person educated: Patient and Parent Education method: Explanation Education comprehension: verbalized understanding  HOME EXERCISE PROGRAM: Access Code: [email or web address] URL:  [email or web address] 10/29/22 - Clam with Resistance  - 2-3 x daily - 7 x weekly - 2 sets - 10 reps - 3 second hold - Dead Bug  - 2-3 x daily - 7 x weekly - 2 sets - 10 reps - Prone Hip Extension  - 2-3 x daily - 7 x weekly - 2 sets - 10 reps - 3 second hold  10/17/22 - Supine March  - 2-3 x daily - 7 x weekly - 2 sets - 10 reps - Supine Bridge  - 2-3 x daily - 7 x weekly - 2 sets - 10 reps - 5 second hold - Sidelying Hip Abduction  - 2-3 x daily - 7 x weekly - 2 sets - 10 reps - Supine Piriformis Stretch with Foot on Ground  - 2-3 x daily - 7 x weekly - 1 sets - 3 reps - 30 second hold   Date: 10/03/2022 Prepared by: Josue Hector  Exercises - Supine Transversus Abdominis Bracing - Hands on Stomach  - 2-3 x daily - 7 x weekly - 2 sets - 10 reps - 5 second hold - Small Range Straight Leg Raise  - 2-3 x daily - 7 x weekly - 2 sets - 10 reps  ASSESSMENT:  CLINICAL IMPRESSION: Patient tolerated session well today. Progressed hip and core strengthening with added resistance band to clamshells. Also progressed core with added deadbugs. Patient did well with this requiring minimal cueing for good return. Patient does require min cueing for appropriate hold times of isometrics for improved target muscle activity. Patient with ongoing mild LBP due to core weakness. Patient will continue to benefit from skilled therapy services to reduce remaining deficits and improve functional ability.    OBJECTIVE IMPAIRMENTS: decreased activity tolerance, decreased mobility, decreased ROM, decreased strength, hypomobility, impaired flexibility, improper body mechanics, postural dysfunction, and pain.   ACTIVITY LIMITATIONS: carrying, lifting, bending, sitting, standing, squatting, sleeping, stairs, transfers, bed mobility, and locomotion level  PARTICIPATION LIMITATIONS: meal prep, cleaning, laundry, shopping, community activity, and yard work  PERSONAL FACTORS: Time since onset of  injury/illness/exacerbation are also affecting patient's functional outcome.   REHAB POTENTIAL: Good  CLINICAL DECISION MAKING: Stable/uncomplicated  EVALUATION COMPLEXITY: Low   GOALS: SHORT TERM GOALS: Target date: 10/24/2022  Patient will be independent with initial HEP and self-management strategies to improve functional outcomes Baseline:  Goal status: INITIAL    LONG TERM GOALS: Target date: 11/14/2022  Patient will be independent with advanced HEP and self-management strategies to improve functional outcomes Baseline:  Goal status: INITIAL  2.  Patient will improve Mod ODI score by at least 8 points to indicate improvement in functional outcomes Baseline: 19/50 Goal status: INITIAL  3.  Patient will report reduction of back pain to <3/10 for improved quality of life and ability to perform ADLs  Baseline: 7/10 Goal status: INITIAL  4. Patient will have equal to or > 4+/5 MMT throughout BLE to improve ability to perform functional mobility, stair ambulation and ADLs.  Baseline: See MMT Goal status: INITIAL  PLAN:  PT FREQUENCY: 1-2x/week  PT DURATION: 6 weeks  PLANNED INTERVENTIONS: Therapeutic exercises, Therapeutic activity, Neuromuscular re-education, Balance training, Gait training, Patient/Family education, Joint manipulation, Joint mobilization, Stair training, Aquatic Therapy, Dry Needling, Electrical stimulation, Spinal manipulation, Spinal mobilization, Cryotherapy, Moist heat, scar mobilization, Taping, Traction, Ultrasound, Biofeedback, Ionotophoresis 4mg /ml Dexamethasone, and Manual therapy. Marland Kitchen  PLAN FOR NEXT SESSION: f/u on dry needling. Continue as indicated. Progress glute and core strengthening as tolerated.   10:02 AM, 10/29/22 Josue Hector PT DPT  Physical Therapist with [facility]  [phone]

## 2022-11-01 ENCOUNTER — Encounter (HOSPITAL_COMMUNITY): Payer: Medicare Other | Admitting: Physical Therapy

## 2022-11-05 ENCOUNTER — Ambulatory Visit (HOSPITAL_COMMUNITY): Payer: Medicare Other | Admitting: Physical Therapy

## 2022-11-05 DIAGNOSIS — M5459 Other low back pain: Secondary | ICD-10-CM

## 2022-11-05 NOTE — Therapy (Signed)
OUTPATIENT PHYSICAL THERAPY THORACOLUMBAR TREATMENT   Patient Name: Tracy [name] MRN: [id number] DOB:11-06-1995, 27 y.o., female Today's Date: 11/05/2022  END OF SESSION:  PT End of Session - 11/05/22 0951     Visit Number 4    Number of Visits 12    Date for PT Re-Evaluation 11/14/22    Authorization Type Medicare/ Medicaid 2ndary    Progress Note Due on Visit 10    PT Start Time [phone]    PT Stop Time 1027    PT Time Calculation (min) 39 min    Activity Tolerance Patient tolerated treatment well    Behavior During Therapy WFL for tasks assessed/performed             Past Medical History:  Diagnosis Date   ADHD (attention deficit hyperactivity disorder)    Bipolar disorder    Obesity    Oppositional defiant disorder    Seasonal allergies    Past Surgical History:  Procedure Laterality Date   ADENOIDECTOMY WITH MYRINGOTOMY     corrective surgery for strabismus     left eye   Patient Active Problem List   Diagnosis Date Noted   Mixed hyperlipidemia 04/14/2013   Bipolar affective disorder, depressed, severe 06/05/2012   ADHD (attention deficit hyperactivity disorder), combined type 06/13/2011   ODD (oppositional defiant disorder) 06/13/2011    PCP: Benita StabileJohn Z Hall MD   REFERRING PROVIDER: Tia AlertJones, David S, MD  REFERRING DIAG: lumbar spondylosis (dry needling)  Rationale for Evaluation and Treatment: Rehabilitation  THERAPY DIAG:  Other low back pain  ONSET DATE: Chronic (>5 years)  SUBJECTIVE:                                                                                                                                                                                           SUBJECTIVE STATEMENT: Feeling a little better. Pain not as bad. No new issues. Feels dry needling is helping also.   Patient presents to therapy with complaint of low back pain. This is ongoing for many years. Patient states pain began insidiously and has gotten progressively  worse. She has had therapy in the past but it was a long time ago. She takes pain meds but it only helps a little.   PERTINENT HISTORY:  NA   PAIN:  Are you having pain? Yes: NPRS scale: 4/10 Pain location: Low back  Pain description: throbbing, aching, sharp  Aggravating factors: standing, bending, lifting Relieving factors: resting, sitting, lying on stomach   PRECAUTIONS: None  WEIGHT BEARING RESTRICTIONS: No  FALLS:  Has patient fallen in last 6 months? No  LIVING ENVIRONMENT: Lives with: lives  with their family Lives in: House/apartment Stairs: No Has following equipment at home: None  OCCUPATION: Disabled   PLOF: Independent  PATIENT GOALS: Help back to feel better   NEXT MD VISIT: Sometime in May   OBJECTIVE:   DIAGNOSTIC FINDINGS:  IMPRESSION: 1. Small right foraminal to extraforaminal disc protrusion at L4-5, closely approximating and potentially irritating the exiting right L4 nerve root. 2. Mild bilateral facet hypertrophy at L2-3 through L4-5. Finding could contribute to lower back pain. 3. Underlying mild levoscoliosis.    PATIENT SURVEYS:  Modified Oswestry 19/50   COGNITION: Overall cognitive status: Within functional limits for tasks assessed     SENSATION: WFL   POSTURE:  slouched seated posture   PALPATION: Min TTP about lumbar paraspinals   LUMBAR ROM:   AROM eval  Flexion 50% limited  Extension 25% limited  Right lateral flexion WNL  Left lateral flexion WNL  Right rotation WNL  Left rotation WNL   (Blank rows = not tested)   LOWER EXTREMITY MMT:    MMT Right eval Left eval  Hip flexion 4+ 4+  Hip extension 4- 4  Hip abduction 4- 4  Hip adduction    Hip internal rotation    Hip external rotation    Knee flexion    Knee extension 4 4  Ankle dorsiflexion 5 5  Ankle plantarflexion    Ankle inversion    Ankle eversion     (Blank rows = not tested)   TODAY'S TREATMENT:                                                                                                                               DATE:  11/05/22 Ab brace 10 x 5" SLR with ab brace 2 x 10 each Bridge 20 x 5"   Deadbug x20    Prone hip extension 20 x 3" hold each  Prone alt UE/ LE extension x 10 each Quadruped hip extension x 10 each   Band rows GTB 2 x 10  Shoulder extension GTB 2 x 10  Manual STM to bilat lumbar paraspinal pre and post dry needling for trigger point identification and surface area preparation   Trigger Point Dry-Needling  Treatment instructions: Expect mild to moderate muscle soreness. S/S of pneumothorax if dry needled over a lung field, and to seek immediate medical attention should they occur. Patient verbalized understanding of these instructions and education.  Patient Consent Given: Yes Education handout provided: Yes Muscles treated: Bilat lumbar paraspinals Electrical stimulation performed: No Parameters: NA Treatment response/outcome: Tolerated well   10/29/22 Ab brace 10 x 5" Ab march x20 SLR with ab brace 2 x 10 each Bridge 20 x 5"    Piriformis stretch 3 x 30" each Deadbug x20    Sidelying clamshell with GTB 20 x 3"   Prone hip extension 10 x 3" hold each   Manual STM to bilat lumbar paraspinal pre and post dry needling for  trigger point identification and surface area preparation   Trigger Point Dry-Needling  Treatment instructions: Expect mild to moderate muscle soreness. S/S of pneumothorax if dry needled over a lung field, and to seek immediate medical attention should they occur. Patient verbalized understanding of these instructions and education.  Patient Consent Given: Yes Education handout provided: Yes Muscles treated: Bilat lumbar paraspinals Electrical stimulation performed: No Parameters: NA Treatment response/outcome: Tolerated well   10/17/22 Ab brace 10 x 5" Ab march x20 SLR with ab brace 2 x 10 each Bridge 20 x 5"   Piriformis stretch 3 x 30" each   Sidelying hip  abduction 2 x 10 each   Manual STM to bilat lumbar paraspinal pre and post dry needling for trigger point identification and surface area preparation   Trigger Point Dry-Needling  Treatment instructions: Expect mild to moderate muscle soreness. S/S of pneumothorax if dry needled over a lung field, and to seek immediate medical attention should they occur. Patient verbalized understanding of these instructions and education.  Patient Consent Given: Yes Education handout provided: Yes Muscles treated: Bilat lumbar paraspinals Electrical stimulation performed: No Parameters: NA Treatment response/outcome: Tolerated well     PATIENT EDUCATION:  Education details: on eval findings, POC, dry needling and HEP  Person educated: Patient and Parent Education method: Explanation Education comprehension: verbalized understanding  HOME EXERCISE PROGRAM: Access Code: [email or web address] URL: [email or web address] 11/05/22 - Beginner Front Arm Support  - 2-3 x daily - 7 x weekly - 2 sets - 10 reps - Standing Shoulder Row with Anchored Resistance  - 2-3 x daily - 7 x weekly - 2 sets - 10 reps - Shoulder extension with resistance - Neutral  - 2-3 x daily - 7 x weekly - 2 sets - 10 reps  10/29/22 - Clam with Resistance  - 2-3 x daily - 7 x weekly - 2 sets - 10 reps - 3 second hold - Dead Bug  - 2-3 x daily - 7 x weekly - 2 sets - 10 reps - Prone Hip Extension  - 2-3 x daily - 7 x weekly - 2 sets - 10 reps - 3 second hold  10/17/22 - Supine March  - 2-3 x daily - 7 x weekly - 2 sets - 10 reps - Supine Bridge  - 2-3 x daily - 7 x weekly - 2 sets - 10 reps - 5 second hold - Sidelying Hip Abduction  - 2-3 x daily - 7 x weekly - 2 sets - 10 reps - Supine Piriformis Stretch with Foot on Ground  - 2-3 x daily - 7 x weekly - 1 sets - 3 reps - 30 second hold   Date: 10/03/2022 Prepared by: Georges Lynch  Exercises - Supine Transversus Abdominis Bracing - Hands on Stomach  - 2-3 x daily - 7 x weekly  - 2 sets - 10 reps - 5 second hold - Small Range Straight Leg Raise  - 2-3 x daily - 7 x weekly - 2 sets - 10 reps  ASSESSMENT:  CLINICAL IMPRESSION: Patient tolerated session well today. Continued with strength progressions for core and multifidus for reducing low back pain. Patient educated on purpose and function of added band rows and extensions and required verbal cues for posturing and TA activation. Issued updated HEP handout. Patient will continue to benefit from skilled therapy services to reduce remaining deficits and improve functional ability.    OBJECTIVE IMPAIRMENTS: decreased activity tolerance, decreased mobility, decreased ROM, decreased strength, hypomobility, impaired flexibility, improper  body mechanics, postural dysfunction, and pain.   ACTIVITY LIMITATIONS: carrying, lifting, bending, sitting, standing, squatting, sleeping, stairs, transfers, bed mobility, and locomotion level  PARTICIPATION LIMITATIONS: meal prep, cleaning, laundry, shopping, community activity, and yard work  PERSONAL FACTORS: Time since onset of injury/illness/exacerbation are also affecting patient's functional outcome.   REHAB POTENTIAL: Good  CLINICAL DECISION MAKING: Stable/uncomplicated  EVALUATION COMPLEXITY: Low   GOALS: SHORT TERM GOALS: Target date: 10/24/2022  Patient will be independent with initial HEP and self-management strategies to improve functional outcomes Baseline:  Goal status: INITIAL    LONG TERM GOALS: Target date: 11/14/2022  Patient will be independent with advanced HEP and self-management strategies to improve functional outcomes Baseline:  Goal status: INITIAL  2.  Patient will improve Mod ODI score by at least 8 points to indicate improvement in functional outcomes Baseline: 19/50 Goal status: INITIAL  3.  Patient will report reduction of back pain to <3/10 for improved quality of life and ability to perform ADLs  Baseline: 7/10 Goal status: INITIAL  4.  Patient will have equal to or > 4+/5 MMT throughout BLE to improve ability to perform functional mobility, stair ambulation and ADLs.  Baseline: See MMT Goal status: INITIAL PLAN:  PT FREQUENCY: 1-2x/week  PT DURATION: 6 weeks  PLANNED INTERVENTIONS: Therapeutic exercises, Therapeutic activity, Neuromuscular re-education, Balance training, Gait training, Patient/Family education, Joint manipulation, Joint mobilization, Stair training, Aquatic Therapy, Dry Needling, Electrical stimulation, Spinal manipulation, Spinal mobilization, Cryotherapy, Moist heat, scar mobilization, Taping, Traction, Ultrasound, Biofeedback, Ionotophoresis 4mg /ml Dexamethasone, and Manual therapy. Marland Kitchen  PLAN FOR NEXT SESSION: f/u on dry needling. Continue as indicated. Progress glute and core strengthening as tolerated.   10:25 AM, 11/05/22 Georges Lynch PT DPT  Physical Therapist with [facility]  [phone]

## 2022-11-08 ENCOUNTER — Encounter (HOSPITAL_COMMUNITY): Payer: Medicare Other | Admitting: Physical Therapy

## 2022-11-12 ENCOUNTER — Encounter (HOSPITAL_COMMUNITY): Payer: Self-pay | Admitting: Physical Therapy

## 2022-11-12 ENCOUNTER — Ambulatory Visit (HOSPITAL_COMMUNITY): Payer: Medicare Other | Admitting: Physical Therapy

## 2022-11-12 DIAGNOSIS — M5459 Other low back pain: Secondary | ICD-10-CM | POA: Diagnosis not present

## 2022-11-12 NOTE — Therapy (Signed)
OUTPATIENT PHYSICAL THERAPY THORACOLUMBAR TREATMENT   Patient Name: Tracy [name] MRN: [id number] DOB:[birth year], 27 y.o., female Today's Date: 11/12/2022  PHYSICAL THERAPY DISCHARGE SUMMARY  Visits from Start of Care: 5  Current functional level related to goals / functional outcomes: See below   Remaining deficits: See below   Education / Equipment: See assessment    Patient agrees to discharge. Patient goals were not met. Patient is being discharged due to lack of progress.  END OF SESSION:  PT End of Session - 11/12/22 1034     Visit Number 5    Number of Visits 12    Date for PT Re-Evaluation 11/14/22    Authorization Type Medicare/ Medicaid 2ndary    Progress Note Due on Visit 10    PT Start Time 1035    PT Stop Time 1113    PT Time Calculation (min) 38 min    Activity Tolerance Patient tolerated treatment well    Behavior During Therapy WFL for tasks assessed/performed             Past Medical History:  Diagnosis Date   ADHD (attention deficit hyperactivity disorder)    Bipolar disorder    Obesity    Oppositional defiant disorder    Seasonal allergies    Past Surgical History:  Procedure Laterality Date   ADENOIDECTOMY WITH MYRINGOTOMY     corrective surgery for strabismus     left eye   Patient Active Problem List   Diagnosis Date Noted   Mixed hyperlipidemia 04/14/2013   Bipolar affective disorder, depressed, severe 06/05/2012   ADHD (attention deficit hyperactivity disorder), combined type 06/13/2011   ODD (oppositional defiant disorder) 06/13/2011    PCP: Benita Stabile MD   REFERRING PROVIDER: Tia Alert, MD  REFERRING DIAG: lumbar spondylosis (dry needling)  Rationale for Evaluation and Treatment: Rehabilitation  THERAPY DIAG:  Other low back pain  ONSET DATE: Chronic (>5 years)  SUBJECTIVE:                                                                                                                                                                                            SUBJECTIVE STATEMENT: Doing ok. Feels back is not much different.   Patient presents to therapy with complaint of low back pain. This is ongoing for many years. Patient states pain began insidiously and has gotten progressively worse. She has had therapy in the past but it was a long time ago. She takes pain meds but it only helps a little.   PERTINENT HISTORY:  NA   PAIN:  Are you having pain? Yes: NPRS scale: 4/10  Pain location: Low back  Pain description: throbbing, aching, sharp  Aggravating factors: standing, bending, lifting Relieving factors: resting, sitting, lying on stomach   PRECAUTIONS: None  WEIGHT BEARING RESTRICTIONS: No  FALLS:  Has patient fallen in last 6 months? No  LIVING ENVIRONMENT: Lives with: lives with their family Lives in: House/apartment Stairs: No Has following equipment at home: None  OCCUPATION: Disabled   PLOF: Independent  PATIENT GOALS: Help back to feel better   NEXT MD VISIT: Sometime in May   OBJECTIVE:   DIAGNOSTIC FINDINGS:  IMPRESSION: 1. Small right foraminal to extraforaminal disc protrusion at L4-5, closely approximating and potentially irritating the exiting right L4 nerve root. 2. Mild bilateral facet hypertrophy at L2-3 through L4-5. Finding could contribute to lower back pain. 3. Underlying mild levoscoliosis.    PATIENT SURVEYS:  Modified Oswestry 17/50 (was 19/50)   COGNITION: Overall cognitive status: Within functional limits for tasks assessed     SENSATION: WFL   POSTURE:  slouched seated posture   PALPATION: Min TTP about lumbar paraspinals   LUMBAR ROM:   AROM eval 11/12/22  Flexion 50% limited 25% limited  Extension 25% limited WNL  Right lateral flexion WNL WNL  Left lateral flexion WNL WNL  Right rotation WNL WNL  Left rotation WNL WNL   (Blank rows = not tested)   LOWER EXTREMITY MMT:    MMT Right eval Left eval  Right 11/12/22 Left 11/12/22  Hip flexion 4+ 4+ 5 5  Hip extension 4- 4 4+ 4+  Hip abduction 4- 4 4+ 4+  Hip adduction      Hip internal rotation      Hip external rotation      Knee flexion      Knee extension 4 4 5 5   Ankle dorsiflexion 5 5 5 5   Ankle plantarflexion      Ankle inversion      Ankle eversion       (Blank rows = not tested)   TODAY'S TREATMENT:                                                                                                                              DATE:  11/12/22 Reassessment  ODI AROM MMT  HEP review   11/05/22 Ab brace 10 x 5" SLR with ab brace 2 x 10 each Bridge 20 x 5"   Deadbug x20    Prone hip extension 20 x 3" hold each  Prone alt UE/ LE extension x 10 each Quadruped hip extension x 10 each   Band rows GTB 2 x 10  Shoulder extension GTB 2 x 10  Manual STM to bilat lumbar paraspinal pre and post dry needling for trigger point identification and surface area preparation   Trigger Point Dry-Needling  Treatment instructions: Expect mild to moderate muscle soreness. S/S of pneumothorax if dry needled over a lung field, and to seek immediate medical attention should they occur. Patient verbalized  understanding of these instructions and education.  Patient Consent Given: Yes Education handout provided: Yes Muscles treated: Bilat lumbar paraspinals Electrical stimulation performed: No Parameters: NA Treatment response/outcome: Tolerated well   10/29/22 Ab brace 10 x 5" Ab march x20 SLR with ab brace 2 x 10 each Bridge 20 x 5"    Piriformis stretch 3 x 30" each Deadbug x20    Sidelying clamshell with GTB 20 x 3"   Prone hip extension 10 x 3" hold each   Manual STM to bilat lumbar paraspinal pre and post dry needling for trigger point identification and surface area preparation   Trigger Point Dry-Needling  Treatment instructions: Expect mild to moderate muscle soreness. S/S of pneumothorax if dry needled over a lung field,  and to seek immediate medical attention should they occur. Patient verbalized understanding of these instructions and education.  Patient Consent Given: Yes Education handout provided: Yes Muscles treated: Bilat lumbar paraspinals Electrical stimulation performed: No Parameters: NA Treatment response/outcome: Tolerated well   10/17/22 Ab brace 10 x 5" Ab march x20 SLR with ab brace 2 x 10 each Bridge 20 x 5"   Piriformis stretch 3 x 30" each   Sidelying hip abduction 2 x 10 each   Manual STM to bilat lumbar paraspinal pre and post dry needling for trigger point identification and surface area preparation   Trigger Point Dry-Needling  Treatment instructions: Expect mild to moderate muscle soreness. S/S of pneumothorax if dry needled over a lung field, and to seek immediate medical attention should they occur. Patient verbalized understanding of these instructions and education.  Patient Consent Given: Yes Education handout provided: Yes Muscles treated: Bilat lumbar paraspinals Electrical stimulation performed: No Parameters: NA Treatment response/outcome: Tolerated well     PATIENT EDUCATION:  Education details: on eval findings, POC, dry needling and HEP  Person educated: Patient and Parent Education method: Explanation Education comprehension: verbalized understanding  HOME EXERCISE PROGRAM: Access Code: [email or web address] URL: [email or web address] 11/05/22 - Beginner Front Arm Support  - 2-3 x daily - 7 x weekly - 2 sets - 10 reps - Standing Shoulder Row with Anchored Resistance  - 2-3 x daily - 7 x weekly - 2 sets - 10 reps - Shoulder extension with resistance - Neutral  - 2-3 x daily - 7 x weekly - 2 sets - 10 reps  10/29/22 - Clam with Resistance  - 2-3 x daily - 7 x weekly - 2 sets - 10 reps - 3 second hold - Dead Bug  - 2-3 x daily - 7 x weekly - 2 sets - 10 reps - Prone Hip Extension  - 2-3 x daily - 7 x weekly - 2 sets - 10 reps - 3 second hold  10/17/22 -  Supine March  - 2-3 x daily - 7 x weekly - 2 sets - 10 reps - Supine Bridge  - 2-3 x daily - 7 x weekly - 2 sets - 10 reps - 5 second hold - Sidelying Hip Abduction  - 2-3 x daily - 7 x weekly - 2 sets - 10 reps - Supine Piriformis Stretch with Foot on Ground  - 2-3 x daily - 7 x weekly - 1 sets - 3 reps - 30 second hold   Date: 10/03/2022 Prepared by: Georges Lynch  Exercises - Supine Transversus Abdominis Bracing - Hands on Stomach  - 2-3 x daily - 7 x weekly - 2 sets - 10 reps - 5 second hold - Small Range Straight Leg Raise  -  2-3 x daily - 7 x weekly - 2 sets - 10 reps  ASSESSMENT:  CLINICAL IMPRESSION: Patient reports no significant change in function, despite objective gains in strength and slight improvement in pain free lumbar AROM. Patient shows good return with home exercise and reports regular compliance with HEP. Patient reports ongoing back pain which has not changed much since starting therapy, also evidenced by minimal improvement reported on ODI. At this time, patient has reached max benefit of therapy and will be DC to home program. Answered all patient questions and encouraged patient to follow up with therapy services with any further questions or concerns.    OBJECTIVE IMPAIRMENTS: decreased activity tolerance, decreased mobility, decreased ROM, decreased strength, hypomobility, impaired flexibility, improper body mechanics, postural dysfunction, and pain.   ACTIVITY LIMITATIONS: carrying, lifting, bending, sitting, standing, squatting, sleeping, stairs, transfers, bed mobility, and locomotion level  PARTICIPATION LIMITATIONS: meal prep, cleaning, laundry, shopping, community activity, and yard work  PERSONAL FACTORS: Time since onset of injury/illness/exacerbation are also affecting patient's functional outcome.   REHAB POTENTIAL: Good  CLINICAL DECISION MAKING: Stable/uncomplicated  EVALUATION COMPLEXITY: Low   GOALS: SHORT TERM GOALS: Target date:  10/24/2022  Patient will be independent with initial HEP and self-management strategies to improve functional outcomes Baseline:  Goal status: MET    LONG TERM GOALS: Target date: 11/14/2022  Patient will be independent with advanced HEP and self-management strategies to improve functional outcomes Baseline:  Goal status: MET  2.  Patient will improve Mod ODI score by at least 8 points to indicate improvement in functional outcomes Baseline: 17/50 Goal status: NOT MET  3.  Patient will report reduction of back pain to <3/10 for improved quality of life and ability to perform ADLs  Baseline: 4/10 Goal status: NOT MET  4. Patient will have equal to or > 4+/5 MMT throughout BLE to improve ability to perform functional mobility, stair ambulation and ADLs.  Baseline: See MMT Goal status: MET PLAN:  PT FREQUENCY: 1-2x/week  PT DURATION: 6 weeks  PLANNED INTERVENTIONS: Therapeutic exercises, Therapeutic activity, Neuromuscular re-education, Balance training, Gait training, Patient/Family education, Joint manipulation, Joint mobilization, Stair training, Aquatic Therapy, Dry Needling, Electrical stimulation, Spinal manipulation, Spinal mobilization, Cryotherapy, Moist heat, scar mobilization, Taping, Traction, Ultrasound, Biofeedback, Ionotophoresis /ml Dexamethasone, and Manual therapy. Marland Kitchen  PLAN FOR NEXT SESSION: DC to HEP   11:10 AM, 11/12/22 Georges Lynch PT DPT  Physical Therapist with [facility]  [phone]

## 2022-11-15 ENCOUNTER — Encounter (HOSPITAL_COMMUNITY): Payer: Medicare Other | Admitting: Physical Therapy

## 2022-11-19 ENCOUNTER — Encounter (HOSPITAL_COMMUNITY): Payer: Medicare Other | Admitting: Physical Therapy

## 2022-11-22 ENCOUNTER — Encounter (HOSPITAL_COMMUNITY): Payer: Medicare Other | Admitting: Physical Therapy

## 2023-01-29 ENCOUNTER — Ambulatory Visit: Payer: Medicare Other | Admitting: Adult Health

## 2023-02-07 ENCOUNTER — Telehealth (HOSPITAL_COMMUNITY): Payer: Medicare Other | Admitting: Psychiatry

## 2023-02-07 ENCOUNTER — Encounter (HOSPITAL_COMMUNITY): Payer: Self-pay | Admitting: Psychiatry

## 2023-02-07 DIAGNOSIS — F39 Unspecified mood [affective] disorder: Secondary | ICD-10-CM

## 2023-02-07 MED ORDER — TRAZODONE HCL 100 MG PO TABS: ORAL_TABLET | ORAL | 3 refills | Status: DC

## 2023-02-07 MED ORDER — BUPROPION HCL ER (XL) 300 MG PO TB24: 300.0000 mg | ORAL_TABLET | Freq: Every day | ORAL | 3 refills | Status: DC

## 2023-02-07 MED ORDER — LAMOTRIGINE 25 MG PO TABS: ORAL_TABLET | ORAL | 3 refills | Status: DC

## 2023-02-07 NOTE — Progress Notes (Signed)
Virtual Visit via Telephone Note  I connected with Tracy [name] on 02/07/23 at 11:00 AM EDT by telephone and verified that I am speaking with the correct person using two identifiers.  Location: Patient: home Provider: office   I discussed the limitations, risks, security and privacy concerns of performing an evaluation and management service by telephone and the availability of in person appointments. I also discussed with the patient that there may be a patient responsible charge related to this service. The patient expressed understanding and agreed to proceed.     I discussed the assessment and treatment plan with the patient. The patient was provided an opportunity to ask questions and all were answered. The patient agreed with the plan and demonstrated an understanding of the instructions.   The patient was advised to call back or seek an in-person evaluation if the symptoms worsen or if the condition fails to improve as anticipated.  I provided 15 minutes of non-face-to-face time during this encounter.   Diannia Ruder, MD  [facility] MD/PA/NP OP Progress Note  02/07/2023 11:11 AM Tracy [name]  MRN:  [id number]  Chief Complaint:  Chief Complaint  Patient presents with   Depression   Anxiety   Follow-up   HPI: This patient is a 27 year old white female who lives with her mother, her mother's partner and her  sister in [place]. Her biological father resides in [place] and she has not seen him in years. She completed [employer] in an occupational program    The patient returns for follow-up after 4 months regarding her mood instability.  She states she continues to do well.  She was in the community theater production in the ensemble of [place].  She really enjoyed it.  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  She is spending most of her time playing video games.  She is sleeping well.  She states her mood is  generally stable but sometimes gets upset if she gets in trouble with her parents.  She denies agitation and thoughts of self-harm.  She is sleeping and eating well Visit Diagnosis:    ICD-10-CM   1. Episodic mood disorder (HCC)  F39       Past Psychiatric History: Hospitalization several times as a child for violent behavior but no hospitalizations since 2013  Past Medical History:  Past Medical History:  Diagnosis Date   ADHD (attention deficit hyperactivity disorder)    Bipolar disorder (HCC)    Obesity    Oppositional defiant disorder    Seasonal allergies     Past Surgical History:  Procedure Laterality Date   ADENOIDECTOMY WITH MYRINGOTOMY     corrective surgery for strabismus     left eye    Family Psychiatric History: See below  Family History:  Family History  Problem Relation Age of Onset   Bipolar disorder Sister    ADD / ADHD Sister    Bipolar disorder Maternal Grandmother     Social History:  Social History   Socioeconomic History   Marital status: Single    Spouse name: Not on file   Number of children: Not on file   Years of education: Not on file   Highest education level: Not on file  Occupational History   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 [employer]  Tobacco Use   Smoking status: Passive Smoke Exposure - Never Smoker  Smokeless tobacco: Never  Vaping Use   Vaping status: Never Used  Substance and Sexual Activity   Alcohol use: No   Drug use: No   Sexual activity: Never    Birth control/protection: Pill  Other Topics Concern   Not on file  Social History Narrative   Lives with mom and younger sister, will be attending college this year   Social Determinants of Health   Financial Resource Strain: Not on file  Food Insecurity: Not on file  Transportation Needs: Not on file  Physical Activity: Not on file  Stress: Not on file  Social Connections: Not on file    Allergies: No Known  Allergies  Metabolic Disorder Labs: Lab Results  Component Value Date   HGBA1C 4.8 09/26/2017   MPG 91 09/26/2017   MPG 88 12/04/2016   Lab Results  Component Value Date   PROLACTIN 34.8 09/11/2014   Lab Results  Component Value Date   CHOL 165 09/29/2018   TRIG 85 09/29/2018   HDL 60 09/29/2018   CHOLHDL 2.8 09/29/2018   VLDL 12 03/21/2017   LDLCALC 87 09/29/2018   LDLCALC 89 03/21/2018   Lab Results  Component Value Date   TSH 0.80 03/21/2018   TSH 0.58 12/04/2016    Therapeutic Level Labs: No results found for: "LITHIUM" No results found for: "VALPROATE" No results found for: "CBMZ"  Current Medications: Current Outpatient Medications  Medication Sig Dispense Refill   buPROPion (WELLBUTRIN XL) 300 MG 24 hr tablet Take 1 tablet (300 mg total) by mouth daily. 90 tablet 3   cetirizine (ZYRTEC) 10 MG tablet TAKE 1 TABLET BY MOUTH EVERY DAY 30 tablet 5   lamoTRIgine (LAMICTAL) 25 MG tablet TAKE 1 TABLET(25 MG) BY MOUTH TWICE DAILY 60 tablet 3   montelukast (SINGULAIR) 10 MG tablet TAKE 1 TABLET BY MOUTH EVERY DAY 30 tablet 5   traZODone (DESYREL) 100 MG tablet TAKE 2 TABLETS(200 MG) BY MOUTH AT BEDTIME 180 tablet 3   TRI-LO-SPRINTEC 0.18/0.215/0.25 MG-25 MCG tab TAKE 1 TABLET BY MOUTH EVERY DAY 28 tablet 0   No current facility-administered medications for this visit.     Musculoskeletal: Strength & Muscle Tone: na Gait & Station: na Patient leans: N/A  Psychiatric Specialty Exam: Review of Systems  All other systems reviewed and are negative.   There were no vitals taken for this visit.There is no height or weight on file to calculate BMI.  General Appearance: NA  Eye Contact:  NA  Speech:  Clear and Coherent  Volume:  Normal  Mood:  Euthymic  Affect:  NA  Thought Process:  Goal Directed  Orientation:  full  Thought Content: WDL   Suicidal Thoughts:  No  Homicidal Thoughts:  No  Memory:  Immediate;   Good Recent;   Fair Remote;   NA  Judgement:   Fair  Insight:  Shallow  Psychomotor Activity:  Normal  Concentration:  Concentration: Fair and Attention Span: Fair  Recall:  [employer] of Knowledge: Fair  Language: Good  Akathisia:  No  Handed:  Right  AIMS (if indicated): not done  Assets:  Communication Skills Desire for Improvement Physical Health Resilience Social Support  ADL's:  Intact  Cognition: Impaired,  Mild  Sleep:  Good   Screenings: GAD-7    Flowsheet Row Office Visit from 03/28/2022 in [facility] [place]  Total GAD-7 Score 14      PHQ2-9    Flowsheet Row Office Visit from 03/28/2022  in [facility] at [place] Video Visit from 01/10/2022 in [facility] at [place] Video Visit from 09/12/2021 in [facility] at [place] Video Visit from 05/01/2021 in [facility] at [place] Office Visit from 10/01/2017 in [facility]  PHQ-2 Total Score 0 0 0 0 0  PHQ-9 Total Score 5 -- -- -- --      Flowsheet Row Office Visit from 03/28/2022 in [facility] at [place] Video Visit from 01/10/2022 in [facility] at [place] Video Visit from 09/12/2021 in [facility] at [place]  C-SSRS RISK CATEGORY No Risk No Risk No Risk        Assessment and Plan: This patient is a 27 year old female with a history of mood swings agitation and mild cognitive impairment.  She continues to do well on her current regimen.  She will continue Lamictal 25 mg twice daily for mood stabilization, Wellbutrin XL 300 mg daily for depression and trazodone 200 mg at bedtime for sleep.  She will return to see me in 4 months  Collaboration of Care: Collaboration of Care: Primary Care Provider AEB notes will be shared with PCP at guardian's request  Patient/Guardian was advised  Release of Information must be obtained prior to any record release in order to collaborate their care with an outside provider. Patient/Guardian was advised if they have not already done so to contact the registration department to sign all necessary forms in order for [place] to release information regarding their care.   Consent: Patient/Guardian gives verbal consent for treatment and assignment of benefits for services provided during this visit. Patient/Guardian expressed understanding and agreed to proceed.    Diannia Ruder, MD 02/07/2023, 11:11 AM

## 2023-03-25 ENCOUNTER — Other Ambulatory Visit (HOSPITAL_COMMUNITY)
Admission: RE | Admit: 2023-03-25 | Discharge: 2023-03-25 | Disposition: A | Discharge status: Home / Self Care | Payer: Medicare Other | Source: Ambulatory Visit | Attending: Adult Health | Admitting: Adult Health

## 2023-03-25 ENCOUNTER — Encounter: Payer: Self-pay | Admitting: Adult Health

## 2023-03-25 ENCOUNTER — Ambulatory Visit (INDEPENDENT_AMBULATORY_CARE_PROVIDER_SITE_OTHER): Payer: Medicare Other | Admitting: Adult Health

## 2023-03-25 VITALS — BP 113/73 | HR 78 | Ht 68.0 in | Wt 185.0 lb

## 2023-03-25 DIAGNOSIS — Z01419 Encounter for gynecological examination (general) (routine) without abnormal findings: Secondary | ICD-10-CM | POA: Diagnosis not present

## 2023-03-25 DIAGNOSIS — Z3041 Encounter for surveillance of contraceptive pills: Secondary | ICD-10-CM

## 2023-03-25 DIAGNOSIS — Z1330 Encounter for screening examination for mental health and behavioral disorders, unspecified: Secondary | ICD-10-CM

## 2023-03-25 DIAGNOSIS — R350 Frequency of micturition: Secondary | ICD-10-CM | POA: Insufficient documentation

## 2023-03-25 DIAGNOSIS — Z1151 Encounter for screening for human papillomavirus (HPV): Secondary | ICD-10-CM | POA: Insufficient documentation

## 2023-03-25 LAB — POCT URINALYSIS DIPSTICK
Blood, UA: NEGATIVE
Glucose, UA: NEGATIVE
Ketones, UA: NEGATIVE
Leukocytes, UA: NEGATIVE
Nitrite, UA: NEGATIVE
Protein, UA: NEGATIVE

## 2023-03-25 MED ORDER — NORGESTIM-ETH ESTRAD TRIPHASIC 0.18/0.215/0.25 MG-25 MCG PO TABS: 1.0000 | ORAL_TABLET | Freq: Every day | ORAL | 12 refills | Status: DC

## 2023-03-25 NOTE — Progress Notes (Signed)
Patient ID: Tracy [name], female   DOB: 1996/04/11, 27 y.o.   MRN: [id number] History of Present Illness: Tracy [name] is a 27 year old white female,single, G0P0, in for a well woman gyn exam and her first pap. She has had urinary frequency lately. She has one of her moms with her.   PCP is Dr Margo Aye    Current Medications, Allergies, Past Medical History, Past Surgical History, Family History and Social History were reviewed in [facility] electronic medical record.     Review of Systems: Patient denies any headaches, hearing loss, fatigue, blurred vision, shortness of breath, chest pain, abdominal pain, problems with bowel movements,  or intercourse.(Has never had sex). No joint pain or mood swings.  +urinary frequency  She is on BCP and happy with her pills.   Physical Exam:BP 113/73 (BP Location: Left Arm, Patient Position: Sitting, Cuff Size: Normal)   Pulse 78   Ht 5\' 8"  (1.727 m)   Wt 185 lb (83.9 kg)   LMP 03/20/2023   BMI 28.13 kg/m  urine dipstick was negative  General:  Well developed, well nourished, no acute distress Skin:  Warm and dry Neck:  Midline trachea, normal thyroid, good ROM, no lymphadenopathy Lungs; Clear to auscultation bilaterally Breast:  No dominant palpable mass, retraction, or nipple discharge Cardiovascular: Regular rate and rhythm Abdomen:  Soft, non tender, no hepatosplenomegaly Pelvic:  External genitalia is normal in appearance, no lesions.  The vagina is normal in appearance, used small Pederson speculum. Urethra has no lesions or masses. The cervix is nulliparous, pap with HR HPV genotyping performed.  Uterus is felt to be normal size, shape, and contour.  No adnexal masses or tenderness noted.Bladder is non tender, no masses felt. Extremities/musculoskeletal:  No swelling or varicosities noted, no clubbing or cyanosis Psych:  No mood changes, alert and cooperative,seems happy AA is 0 Fall risk is low    03/25/2023    2:51 PM 03/28/2022     9:47 AM 03/28/2022    9:41 AM  Depression screen PHQ 2/9  Decreased Interest 0    Down, Depressed, Hopeless 0    PHQ - 2 Score 0    Altered sleeping 0    Tired, decreased energy 1    Change in appetite 0    Feeling bad or failure about yourself  2    Trouble concentrating 0    Moving slowly or fidgety/restless 2    Suicidal thoughts 0    PHQ-9 Score 5    Difficult doing work/chores        Information is confidential and restricted. Go to Review Flowsheets to unlock data.   On meds and see Dr Tenny [facility] at [facility]    03/25/2023    2:51 PM 03/28/2022    9:41 AM  GAD 7 : Generalized Anxiety Score  Nervous, Anxious, on Edge 0   Control/stop worrying 0   Worry too much - different things 0   Trouble relaxing 0   Restless 0   Easily annoyed or irritable 0   Afraid - awful might happen 0   Total GAD 7 Score 0   Anxiety Difficulty       Information is confidential and restricted. Go to Review Flowsheets to unlock data.      [facility] - 03/25/23 1450       Pregnancy Intention Screening   Does the patient want to become pregnant in the next year? No    Does the patient's partner want to become pregnant  in the next year? No    Would the patient like to discuss contraceptive options today? No      Contraception Wrap Up   Current Method Abstinence;Oral Contraceptive    End Method Abstinence;Oral Contraceptive    Contraception Counseling Provided No            Examination chaperoned by Malachy Mood LPN  Impression and Plan:   1. Encounter for gynecological examination with Papanicolaou smear of cervix Pap sent Pap in 3 years if normal Physical in 1 year Labs with PCP - Cytology - PAP( [place])  2. Urinary frequency Urine dipstick was negative  - POCT Urinalysis Dipstick  3. Encounter for surveillance of contraceptive pills Happy with BCP will refill Meds ordered this encounter  Medications   Norgestimate-Ethinyl Estradiol Triphasic (TRI-LO-SPRINTEC) 0.18/0.215/0.25 MG-25  MCG tab    Sig: Take 1 tablet by mouth daily.    Dispense:  28 tablet    Refill:  12    Order Specific Question:   Supervising Provider    Answer:   Duane Lope H [[id number]]

## 2023-03-27 LAB — CYTOLOGY - PAP
Comment: NEGATIVE
Diagnosis: NEGATIVE
High risk HPV: NEGATIVE

## 2023-06-21 ENCOUNTER — Other Ambulatory Visit (HOSPITAL_COMMUNITY): Payer: Self-pay | Admitting: Psychiatry

## 2023-10-07 ENCOUNTER — Other Ambulatory Visit (HOSPITAL_COMMUNITY): Payer: Self-pay | Admitting: Psychiatry

## 2023-10-07 NOTE — Telephone Encounter (Signed)
 Call for appt

## 2023-10-10 NOTE — Telephone Encounter (Signed)
 Scheduled 10-17-23

## 2023-10-17 ENCOUNTER — Telehealth (HOSPITAL_COMMUNITY): Admitting: Psychiatry

## 2023-10-17 ENCOUNTER — Encounter (HOSPITAL_COMMUNITY): Payer: Self-pay | Admitting: Psychiatry

## 2023-10-17 DIAGNOSIS — F39 Unspecified mood [affective] disorder: Secondary | ICD-10-CM

## 2023-10-17 MED ORDER — LAMOTRIGINE 25 MG PO TABS: ORAL_TABLET | ORAL | 3 refills | Status: DC

## 2023-10-17 MED ORDER — BUPROPION HCL ER (XL) 300 MG PO TB24: 300.0000 mg | ORAL_TABLET | Freq: Every day | ORAL | 3 refills | Status: DC

## 2023-10-17 MED ORDER — TRAZODONE HCL 100 MG PO TABS: ORAL_TABLET | ORAL | 3 refills | Status: DC

## 2023-10-17 NOTE — Progress Notes (Signed)
 Virtual Visit via Telephone Note  I connected with Tracy [name] on 10/17/23 at 11:00 AM EDT by telephone and verified that I am speaking with the correct person using two identifiers.  Location: Patient: home Provider: office   I discussed the limitations, risks, security and privacy concerns of performing an evaluation and management service by telephone and the availability of in person appointments. I also discussed with the patient that there may be a patient responsible charge related to this service. The patient expressed understanding and agreed to proceed.       I discussed the assessment and treatment plan with the patient. The patient was provided an opportunity to ask questions and all were answered. The patient agreed with the plan and demonstrated an understanding of the instructions.   The patient was advised to call back or seek an in-person evaluation if the symptoms worsen or if the condition fails to improve as anticipated.  I provided 20 minutes of non-face-to-face time during this encounter.   Diannia Ruder, MD  [facility] MD/PA/NP OP Progress Note  10/17/2023 11:07 AM Tracy [name]  MRN:  [id number]  Chief Complaint:  Chief Complaint  Patient presents with   Depression   Follow-up   HPI: This patient is a 28 year old white female who lives with her mother, her mother's partner and her  sister in [place]. Her biological father resides in [place] and she has not seen him in years. She completed [employer] in an occupational program    The patient returns for follow-up after 4 months regarding her mood instability.  She states that she is continuing to do well.  She spends time playing video games and helping out her grandmother.  She is sleeping well.  Her mood is generally good but sometimes she gets upset if she gets in trouble with her parents.  She denies agitation or thoughts or self-harm.  She is sleeping and eating well Visit  Diagnosis:    ICD-10-CM   1. Episodic mood disorder (HCC)  F39       Past Psychiatric History: Hospitalization several times as a child for violent behavior but no hospitalizations since 2013  Past Medical History:  Past Medical History:  Diagnosis Date   ADHD (attention deficit hyperactivity disorder)    Bipolar disorder (HCC)    Obesity    Oppositional defiant disorder    Seasonal allergies     Past Surgical History:  Procedure Laterality Date   ADENOIDECTOMY WITH MYRINGOTOMY     corrective surgery for strabismus     left eye    Family Psychiatric History: See below  Family History:  Family History  Problem Relation Age of Onset   Bipolar disorder Maternal Grandmother    Drug abuse Maternal Grandmother    Heart attack Mother    High Cholesterol Mother    Bipolar disorder Sister    ADD / ADHD Sister     Social History:  Social History   Socioeconomic History   Marital status: Single    Spouse name: Not on file   Number of children: Not on file   Years of education: Not on file   Highest education level: Not on file  Occupational History   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 [employer]  Tobacco Use   Smoking status: Never    Passive exposure: Yes   Smokeless tobacco: Never  Vaping Use   Vaping status: Never Used  Substance and Sexual Activity   Alcohol use: No   Drug use: No   Sexual activity: Never    Birth control/protection: Pill  Other Topics Concern   Not on file  Social History Narrative   Lives with mom and younger sister, will be attending college this year   Social Drivers of Health   Financial Resource Strain: Low Risk  (03/25/2023)   Overall Financial Resource Strain (CARDIA)    Difficulty of Paying Living Expenses: Not hard at all  Food Insecurity: No Food Insecurity (03/25/2023)   Hunger Vital Sign    Worried About Running Out of Food in the Last Year: Never true    Ran Out of Food in the Last  Year: Never true  Transportation Needs: No Transportation Needs (03/25/2023)   PRAPARE - Administrator, Civil Service (Medical): No    Lack of Transportation (Non-Medical): No  Physical Activity: Insufficiently Active (03/25/2023)   Exercise Vital Sign    Days of Exercise per Week: 4 days    Minutes of Exercise per Session: 30 min  Stress: No Stress Concern Present (03/25/2023)   [employer] of Occupational Health - Occupational Stress Questionnaire    Feeling of Stress : Not at all  Social Connections: Unknown (03/25/2023)   Social Connection and Isolation Panel [NHANES]    Frequency of Communication with Friends and Family: Twice a week    Frequency of Social Gatherings with Friends and Family: Twice a week    Attends Religious Services: Patient declined    Database administrator or Organizations: No    Attends Engineer, structural: Never    Marital Status: Never married    Allergies: No Known Allergies  Metabolic Disorder Labs: Lab Results  Component Value Date   HGBA1C 4.8 09/26/2017   MPG 91 09/26/2017   MPG 88 12/04/2016   Lab Results  Component Value Date   PROLACTIN 34.8 09/11/2014   Lab Results  Component Value Date   CHOL 165 09/29/2018   TRIG 85 09/29/2018   HDL 60 09/29/2018   CHOLHDL 2.8 09/29/2018   VLDL 12 03/21/2017   LDLCALC 87 09/29/2018   LDLCALC 89 03/21/2018   Lab Results  Component Value Date   TSH 0.80 03/21/2018   TSH 0.58 12/04/2016    Therapeutic Level Labs: No results found for: "LITHIUM" No results found for: "VALPROATE" No results found for: "CBMZ"  Current Medications: Current Outpatient Medications  Medication Sig Dispense Refill   buPROPion (WELLBUTRIN XL) 300 MG 24 hr tablet Take 1 tablet (300 mg total) by mouth daily. 90 tablet 3   cetirizine (ZYRTEC) 10 MG tablet TAKE 1 TABLET BY MOUTH EVERY DAY 30 tablet 5   lamoTRIgine (LAMICTAL) 25 MG tablet TAKE 1 TABLET(25 MG) BY MOUTH TWICE DAILY 60 tablet 3    meloxicam (MOBIC) 7.5 MG tablet Take 7.5 mg by mouth daily.     methocarbamol (ROBAXIN) 500 MG tablet Take 500 mg by mouth at bedtime.     montelukast (SINGULAIR) 10 MG tablet TAKE 1 TABLET BY MOUTH EVERY DAY 30 tablet 5   Norgestimate-Ethinyl Estradiol Triphasic (TRI-LO-SPRINTEC) 0.18/0.215/0.25 MG-25 MCG tab Take 1 tablet by mouth daily. 28 tablet 12   traZODone (DESYREL) 100 MG tablet TAKE 2 TABLETS(200 MG) BY MOUTH AT BEDTIME 180 tablet 3   No current facility-administered medications for this visit.     Musculoskeletal: Strength & Muscle Tone: na Gait & Station: na Patient leans: N/A  Psychiatric Specialty Exam: Review  of Systems  All other systems reviewed and are negative.   There were no vitals taken for this visit.There is no height or weight on file to calculate BMI.  General Appearance: na  Eye Contact:  NA  Speech: Clear and coherent  Volume:  Normal  Mood:  Euthymic  Affect:  Congruent  Thought Process:  Goal Directed  Orientation:  Full (Time, Place, and Person)  Thought Content: WDL   Suicidal Thoughts:  No  Homicidal Thoughts: no  Memory:  Immediate;   Good Recent;   Good Remote;   NA  Judgement:  Fair  Insight:  Fair  Psychomotor Activity:  Normal  Concentration:  Concentration: Good and Attention Span: Fair  Recall:  [employer] of Knowledge: Fair  Language: Fair  Akathisia:  No  Handed:  Right  AIMS (if indicated): not done  Assets:  Communication Skills Desire for Improvement Physical Health Resilience Social Support  ADL's:  Intact  Cognition: mild impairment  Sleep:  Good   Screenings: GAD-7    Loss adjuster, chartered Office Visit from 03/25/2023 in [facility] at [facility] Office Visit from 03/28/2022 in [facility] at [place]  Total GAD-7 Score 0 14      PHQ2-9    Flowsheet Row Office Visit from 03/25/2023 in [facility] at [facility] Office Visit  from 03/28/2022 in [facility] at [place] Video Visit from 01/10/2022 in [facility] at [place] Video Visit from 09/12/2021 in [facility] at [place] Video Visit from 05/01/2021 in [facility] at [facility] Total Score 0 0 0 0 0  PHQ-9 Total Score 5 5 -- -- --      Flowsheet Row Office Visit from 03/28/2022 in [facility] at [place] Video Visit from 01/10/2022 in [facility] at [place] Video Visit from 09/12/2021 in [facility] at [place]  C-SSRS RISK CATEGORY No Risk No Risk No Risk        Assessment and Plan: This patient is a 28 year old female with a history of mood swings agitation and mild cognitive impairment.  She continues to do well on her current regimen.  She will continue Lamictal 25 mg twice daily for mood stabilization, Wellbutrin XL 300 mg daily for depression and trazodone 200 mg at bedtime for sleep.  She will return to see me in 4 months  Collaboration of Care: Collaboration of Care: Primary Care Provider AEB notes will be shared with PCP at guardian's request  Patient/Guardian was advised Release of Information must be obtained prior to any record release in order to collaborate their care with an outside provider. Patient/Guardian was advised if they have not already done so to contact the registration department to sign all necessary forms in order for [place] to release information regarding their care.   Consent: Patient/Guardian gives verbal consent for treatment and assignment of benefits for services provided during this visit. Patient/Guardian expressed understanding and agreed to proceed.    Diannia Ruder, MD 10/17/2023, 11:07 AM

## 2024-02-04 ENCOUNTER — Encounter (HOSPITAL_COMMUNITY): Payer: Self-pay | Admitting: Psychiatry

## 2024-02-04 ENCOUNTER — Telehealth (INDEPENDENT_AMBULATORY_CARE_PROVIDER_SITE_OTHER): Admitting: Psychiatry

## 2024-02-04 DIAGNOSIS — F39 Unspecified mood [affective] disorder: Secondary | ICD-10-CM

## 2024-02-04 MED ORDER — LAMOTRIGINE 25 MG PO TABS: ORAL_TABLET | ORAL | 3 refills | Status: AC

## 2024-02-04 MED ORDER — BUPROPION HCL ER (XL) 300 MG PO TB24: 300.0000 mg | ORAL_TABLET | Freq: Every day | ORAL | 3 refills | Status: AC

## 2024-02-04 MED ORDER — TRAZODONE HCL 100 MG PO TABS: ORAL_TABLET | ORAL | 3 refills | Status: AC
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
## 2024-03-28 ENCOUNTER — Other Ambulatory Visit: Payer: Self-pay | Admitting: Adult Health

## 2024-04-03 ENCOUNTER — Other Ambulatory Visit (HOSPITAL_COMMUNITY): Payer: Self-pay | Admitting: Psychiatry
